# Patient Record
Sex: Female | Born: 1935 | Race: White | Hispanic: No | Smoking: Never smoker
Health system: Southern US, Community
[De-identification: ages and names within clinical notes are randomized; demographics above are authoritative.]

## PROBLEM LIST (undated history)

## (undated) DIAGNOSIS — K7689 Other specified diseases of liver: Secondary | ICD-10-CM

## (undated) DIAGNOSIS — F32A Depression, unspecified: Secondary | ICD-10-CM

## (undated) DIAGNOSIS — K5792 Diverticulitis of intestine, part unspecified, without perforation or abscess without bleeding: Secondary | ICD-10-CM

## (undated) DIAGNOSIS — F419 Anxiety disorder, unspecified: Secondary | ICD-10-CM

## (undated) DIAGNOSIS — I10 Essential (primary) hypertension: Secondary | ICD-10-CM

## (undated) DIAGNOSIS — K5732 Diverticulitis of large intestine without perforation or abscess without bleeding: Secondary | ICD-10-CM

## (undated) DIAGNOSIS — D126 Benign neoplasm of colon, unspecified: Secondary | ICD-10-CM

## (undated) DIAGNOSIS — Z8719 Personal history of other diseases of the digestive system: Secondary | ICD-10-CM

## (undated) DIAGNOSIS — K219 Gastro-esophageal reflux disease without esophagitis: Secondary | ICD-10-CM

## (undated) DIAGNOSIS — F329 Major depressive disorder, single episode, unspecified: Secondary | ICD-10-CM

## (undated) DIAGNOSIS — M1990 Unspecified osteoarthritis, unspecified site: Secondary | ICD-10-CM

## (undated) HISTORY — DX: Diverticulitis of large intestine without perforation or abscess without bleeding: K57.32

## (undated) HISTORY — DX: Benign neoplasm of colon, unspecified: D12.6

## (undated) HISTORY — PX: TONSILLECTOMY: SUR1361

## (undated) HISTORY — DX: Unspecified osteoarthritis, unspecified site: M19.90

## (undated) HISTORY — PX: BREAST SURGERY: SHX581

## (undated) HISTORY — DX: Gastro-esophageal reflux disease without esophagitis: K21.9

## (undated) HISTORY — DX: Essential (primary) hypertension: I10

## (undated) HISTORY — DX: Anxiety disorder, unspecified: F41.9

## (undated) HISTORY — DX: Other specified diseases of liver: K76.89

## (undated) HISTORY — PX: ABDOMINAL HYSTERECTOMY: SHX81

## (undated) HISTORY — PX: VAGINAL HYSTERECTOMY: SUR661

## (undated) HISTORY — DX: Depression, unspecified: F32.A

## (undated) HISTORY — PX: EYE SURGERY: SHX253

## (undated) HISTORY — DX: Major depressive disorder, single episode, unspecified: F32.9

## (undated) HISTORY — PX: BUNIONECTOMY: SHX129

## (undated) HISTORY — PX: OOPHORECTOMY: SHX86

---

## 1998-10-23 ENCOUNTER — Encounter: Payer: Self-pay | Admitting: *Deleted

## 1998-10-23 ENCOUNTER — Ambulatory Visit (HOSPITAL_COMMUNITY): Admission: RE | Admit: 1998-10-23 | Discharge: 1998-10-23 | Payer: Self-pay | Admitting: *Deleted

## 2001-03-02 ENCOUNTER — Other Ambulatory Visit: Admission: RE | Admit: 2001-03-02 | Discharge: 2001-03-02 | Payer: Self-pay | Admitting: Obstetrics and Gynecology

## 2002-05-13 ENCOUNTER — Other Ambulatory Visit: Admission: RE | Admit: 2002-05-13 | Discharge: 2002-05-13 | Payer: Self-pay | Admitting: Obstetrics and Gynecology

## 2002-06-26 ENCOUNTER — Ambulatory Visit (HOSPITAL_COMMUNITY): Admission: RE | Admit: 2002-06-26 | Discharge: 2002-06-26 | Payer: Self-pay | Admitting: Neurology

## 2002-06-26 ENCOUNTER — Encounter: Payer: Self-pay | Admitting: Neurology

## 2003-01-01 DIAGNOSIS — D126 Benign neoplasm of colon, unspecified: Secondary | ICD-10-CM

## 2003-01-01 HISTORY — DX: Benign neoplasm of colon, unspecified: D12.6

## 2005-01-27 ENCOUNTER — Other Ambulatory Visit: Admission: RE | Admit: 2005-01-27 | Discharge: 2005-01-27 | Payer: Self-pay | Admitting: Obstetrics and Gynecology

## 2005-03-31 ENCOUNTER — Encounter: Admission: RE | Admit: 2005-03-31 | Discharge: 2005-05-19 | Payer: Self-pay | Admitting: *Deleted

## 2005-08-19 ENCOUNTER — Ambulatory Visit: Payer: Self-pay | Admitting: Gastroenterology

## 2005-10-04 ENCOUNTER — Ambulatory Visit: Payer: Self-pay | Admitting: Gastroenterology

## 2005-10-10 ENCOUNTER — Ambulatory Visit: Payer: Self-pay | Admitting: Gastroenterology

## 2006-05-05 ENCOUNTER — Ambulatory Visit: Payer: Self-pay | Admitting: Gastroenterology

## 2006-05-05 LAB — CONVERTED CEMR LAB
ALT: 28 units/L (ref 0–40)
AST: 26 units/L (ref 0–37)
Albumin: 4 g/dL (ref 3.5–5.2)
Alkaline Phosphatase: 60 units/L (ref 39–117)
Bilirubin, Direct: 0.1 mg/dL (ref 0.0–0.3)
Lipase: 40 units/L (ref 11.0–59.0)
Total Bilirubin: 0.5 mg/dL (ref 0.3–1.2)
Total Protein: 6.7 g/dL (ref 6.0–8.3)

## 2006-05-08 ENCOUNTER — Ambulatory Visit: Payer: Self-pay | Admitting: Cardiology

## 2006-05-31 ENCOUNTER — Ambulatory Visit: Payer: Self-pay | Admitting: Gastroenterology

## 2010-02-21 ENCOUNTER — Encounter: Payer: Self-pay | Admitting: Gastroenterology

## 2010-04-21 ENCOUNTER — Emergency Department (HOSPITAL_COMMUNITY)
Admission: EM | Admit: 2010-04-21 | Discharge: 2010-04-21 | Disposition: A | Discharge status: Home / Self Care | Payer: Medicare Other | Attending: Emergency Medicine | Admitting: Emergency Medicine

## 2010-04-21 DIAGNOSIS — M79609 Pain in unspecified limb: Secondary | ICD-10-CM | POA: Insufficient documentation

## 2010-04-21 DIAGNOSIS — T394X5A Adverse effect of antirheumatics, not elsewhere classified, initial encounter: Secondary | ICD-10-CM | POA: Insufficient documentation

## 2010-04-21 DIAGNOSIS — L2989 Other pruritus: Secondary | ICD-10-CM | POA: Insufficient documentation

## 2010-04-21 DIAGNOSIS — I10 Essential (primary) hypertension: Secondary | ICD-10-CM | POA: Insufficient documentation

## 2010-04-21 DIAGNOSIS — L298 Other pruritus: Secondary | ICD-10-CM | POA: Insufficient documentation

## 2010-04-21 DIAGNOSIS — R0789 Other chest pain: Secondary | ICD-10-CM | POA: Insufficient documentation

## 2010-04-21 DIAGNOSIS — F3289 Other specified depressive episodes: Secondary | ICD-10-CM | POA: Insufficient documentation

## 2010-04-21 DIAGNOSIS — F329 Major depressive disorder, single episode, unspecified: Secondary | ICD-10-CM | POA: Insufficient documentation

## 2010-04-21 LAB — DIFFERENTIAL
Basophils Absolute: 0 10*3/uL (ref 0.0–0.1)
Basophils Relative: 0 % (ref 0–1)
Eosinophils Absolute: 0.1 10*3/uL (ref 0.0–0.7)
Eosinophils Relative: 1 % (ref 0–5)
Lymphocytes Relative: 52 % — ABNORMAL HIGH (ref 12–46)
Lymphs Abs: 4.8 10*3/uL — ABNORMAL HIGH (ref 0.7–4.0)
Monocytes Absolute: 0.7 10*3/uL (ref 0.1–1.0)
Monocytes Relative: 8 % (ref 3–12)
Neutro Abs: 3.7 10*3/uL (ref 1.7–7.7)
Neutrophils Relative %: 39 % — ABNORMAL LOW (ref 43–77)

## 2010-04-21 LAB — POCT I-STAT, CHEM 8
BUN: 22 mg/dL (ref 6–23)
Chloride: 106 mEq/L (ref 96–112)
Creatinine, Ser: 1.2 mg/dL (ref 0.4–1.2)
Potassium: 3.5 mEq/L (ref 3.5–5.1)
Sodium: 141 mEq/L (ref 135–145)
TCO2: 25 mmol/L (ref 0–100)

## 2010-04-21 LAB — POCT CARDIAC MARKERS
CKMB, poc: <1 ng/mL — ABNORMAL LOW (ref 1.0–8.0)
Myoglobin, poc: 43.4 ng/mL (ref 12–200)
Troponin i, poc: <0.05 ng/mL (ref 0.00–0.09)

## 2010-04-21 LAB — CBC
Hemoglobin: 14 g/dL (ref 12.0–15.0)
MCH: 30.4 pg (ref 26.0–34.0)
Platelets: 283 10*3/uL (ref 150–400)
RBC: 4.61 MIL/uL (ref 3.87–5.11)
WBC: 9.3 10*3/uL (ref 4.0–10.5)

## 2010-11-05 ENCOUNTER — Encounter: Payer: Self-pay | Admitting: Gastroenterology

## 2010-11-30 ENCOUNTER — Encounter: Payer: Self-pay | Admitting: Gastroenterology

## 2011-01-13 ENCOUNTER — Telehealth: Payer: Self-pay | Admitting: Gastroenterology

## 2011-01-13 NOTE — Telephone Encounter (Signed)
All questions answered, she will keep her appt for pre-visit on Monday

## 2011-01-20 ENCOUNTER — Encounter: Payer: Medicare Other | Admitting: Gastroenterology

## 2011-02-04 ENCOUNTER — Ambulatory Visit (AMBULATORY_SURGERY_CENTER): Payer: Medicare Other | Admitting: *Deleted

## 2011-02-04 ENCOUNTER — Encounter: Payer: Self-pay | Admitting: Gastroenterology

## 2011-02-04 VITALS — Ht 63.0 in | Wt 168.0 lb

## 2011-02-04 DIAGNOSIS — Z86010 Personal history of colonic polyps: Secondary | ICD-10-CM

## 2011-02-04 MED ORDER — PEG-KCL-NACL-NASULF-NA ASC-C 100 G PO SOLR: ORAL | Status: DC

## 2011-02-04 NOTE — Progress Notes (Signed)
Patient c/o reflux symptoms everyday, takes tums for this. Patient wants EGD done. Explained to patient the need for office visit,she understands and agrees. Made office visit for 02/16/11 at 10:15 am. Patient wants to keep colonoscopy date at this time but she does want both exams same day. Explained to patient if Dr.Stark wants this then they may have to reschedule colonoscopy. She understands. Sheri Jones,RN notified.

## 2011-02-16 ENCOUNTER — Encounter: Payer: Self-pay | Admitting: Gastroenterology

## 2011-02-16 ENCOUNTER — Ambulatory Visit (INDEPENDENT_AMBULATORY_CARE_PROVIDER_SITE_OTHER): Payer: Medicare Other | Admitting: Gastroenterology

## 2011-02-16 VITALS — BP 130/74 | HR 96 | Ht 63.0 in | Wt 163.2 lb

## 2011-02-16 DIAGNOSIS — Z860100 Personal history of colon polyps, unspecified: Secondary | ICD-10-CM

## 2011-02-16 DIAGNOSIS — K219 Gastro-esophageal reflux disease without esophagitis: Secondary | ICD-10-CM

## 2011-02-16 MED ORDER — PANTOPRAZOLE SODIUM 40 MG PO TBEC: 40.0000 mg | DELAYED_RELEASE_TABLET | Freq: Every day | ORAL | Status: DC

## 2011-02-16 NOTE — Progress Notes (Signed)
History of Present Illness: This is a 77 year old female who has a long history of acid reflux. She an upper endoscopy by Dr. Davonna Belling in September 2007 which showed esophagitis. She was prescribed Prevacid which she states was not effective. She was eventually changed to omeprazole which effectively controlled her reflux symptoms but she developed thinning hair and was concerned it was related to omeprazole so she discontinued the medication. She has daily symptoms of nocturnal symptoms on no acid medications at all. She is scheduled for colonoscopy suite for followup of colon polyps. Denies weight loss, abdominal pain, constipation, diarrhea, change in stool caliber, melena, hematochezia, nausea, vomiting, dysphagia, chest pain.  Allergies  Allergen Reactions  . Celebrex (Celecoxib) Anaphylaxis  . Ciprofloxacin Hcl Hives  . Flagyl (Metronidazole Hcl) Hives  . Nsaids Other (See Comments)    Because of reaction to celebrex.   Outpatient Prescriptions Prior to Visit  Medication Sig Dispense Refill  . BIOTIN PO Take 10,000 mcg by mouth daily.        . citalopram (CELEXA) 20 MG tablet Take 20 mg by mouth daily.        Marland Kitchen lisinopril-hydrochlorothiazide (PRINZIDE,ZESTORETIC) 20-12.5 MG per tablet Take 1 tablet by mouth daily.        . Omega-3 Krill Oil 500 MG CAPS Take 1 capsule by mouth daily.        . peg 3350 powder (MOVIPREP) 100 G SOLR Moviprep-take as directed.  1 kit  0  . UNABLE TO FIND Take 2 tablets by mouth daily. Med Name: Tumeric 500mg         Past Medical History  Diagnosis Date  . Hypertension   . Depression   . Anxiety   . Arthritis   . Cataract   . Diverticulitis, colon   . Liver cyst   . Adenomatous colon polyp 01/2003  . GERD (gastroesophageal reflux disease)    Past Surgical History  Procedure Date  . Abdominal hysterectomy   . Tonsillectomy   . Breast surgery     benign breast tumors removed x3  . Oophorectomy    History   Social History  . Marital Status:  Married    Spouse Name: N/A    Number of Children: N/A  . Years of Education: N/A   Social History Main Topics  . Smoking status: Never Smoker   . Smokeless tobacco: Never Used  . Alcohol Use: 0.0 oz/week     occasional  . Drug Use: No  . Sexually Active: None   Other Topics Concern  . None   Social History Narrative  . None   Family History  Problem Relation Age of Onset  . Colon cancer Neg Hx   . Stomach cancer Neg Hx   . Esophageal cancer Neg Hx   . Breast cancer Sister   . Prostate cancer Brother     ????  . Diabetes Other     granddaughter  . Heart disease Mother   . Heart disease Father     Review of Systems: Pertinent positive and negative review of systems were noted in the above HPI section. All other review of systems were otherwise negative.   Physical Exam: General: Well developed , well nourished, no acute distress Head: Normocephalic and atraumatic Eyes:  sclerae anicteric, EOMI Ears: Normal auditory acuity Mouth: No deformity or lesions Neck: Supple, no masses or thyromegaly Lungs: Clear throughout to auscultation Heart: Regular rate and rhythm; no murmurs, rubs or bruits Abdomen: Soft, non tender and non distended.  No masses, hepatosplenomegaly or hernias noted. Normal Bowel sounds Rectal: Deferred to colonoscopy on Friday Musculoskeletal: Symmetrical with no gross deformities  Skin: No lesions on visible extremities Pulses:  Normal pulses noted Extremities: No clubbing, cyanosis, edema or deformities noted Neurological: Alert oriented x 4, grossly nonfocal Cervical Nodes:  No significant cervical adenopathy Inguinal Nodes: No significant inguinal adenopathy Psychological:  Alert and cooperative. Normal mood and affect  Assessment and Recommendations:  1. Chronic GERD with a history of esophagitis on her last upper endoscopy. She needs to be on long-term acid suppressing medication and antireflux measures for symptom control. Trial of  pantoprazole 40 mg daily.  2. Personal history of adenomatous colon polyps initially diagnosed in December 2004. She is due for a 5 years surveillance colonoscopy been scheduled for Friday.

## 2011-02-16 NOTE — Patient Instructions (Signed)
We have sent a prescription for pantoprazole to your pharmacy to take one tablet by mouth once daily.  cc: Daisy Floro, MD

## 2011-02-18 ENCOUNTER — Ambulatory Visit (AMBULATORY_SURGERY_CENTER): Payer: Medicare Other | Admitting: Gastroenterology

## 2011-02-18 ENCOUNTER — Encounter: Payer: Medicare Other | Admitting: Gastroenterology

## 2011-02-18 ENCOUNTER — Encounter: Payer: Self-pay | Admitting: Gastroenterology

## 2011-02-18 DIAGNOSIS — D126 Benign neoplasm of colon, unspecified: Secondary | ICD-10-CM

## 2011-02-18 DIAGNOSIS — Z1211 Encounter for screening for malignant neoplasm of colon: Secondary | ICD-10-CM

## 2011-02-18 DIAGNOSIS — Z86010 Personal history of colonic polyps: Secondary | ICD-10-CM

## 2011-02-18 MED ORDER — SODIUM CHLORIDE 0.9 % IV SOLN: 500.0000 mL | INTRAVENOUS | Status: DC

## 2011-02-18 NOTE — Progress Notes (Signed)
Patient did not experience any of the following events: a burn prior to discharge; a fall within the facility; wrong site/side/patient/procedure/implant event; or a hospital transfer or hospital admission upon discharge from the facility. (G8907) Patient did not have preoperative order for IV antibiotic SSI prophylaxis. (G8918)  

## 2011-02-18 NOTE — Patient Instructions (Signed)
Please refer to your blue and neon green sheets for instructions regarding diet and activity for the rest of today.  You may resume your medications as you would normally take them.   Hemorrhoids Hemorrhoids are enlarged (dilated) veins around the rectum. There are 2 types of hemorrhoids, and the type of hemorrhoid is determined by its location. Internal hemorrhoids occur in the veins just inside the rectum.They are usually not painful, but they may bleed.However, they may poke through to the outside and become irritated and painful. External hemorrhoids involve the veins outside the anus and can be felt as a painful swelling or hard lump near the anus.They are often itchy and may crack and bleed. Sometimes clots will form in the veins. This makes them swollen and painful. These are called thrombosed hemorrhoids. CAUSES Causes of hemorrhoids include:  Pregnancy. This increases the pressure in the hemorrhoidal veins.   Constipation.   Straining to have a bowel movement.   Obesity.   Heavy lifting or other activity that caused you to strain.  TREATMENT Most of the time hemorrhoids improve in 1 to 2 weeks. However, if symptoms do not seem to be getting better or if you have a lot of rectal bleeding, your caregiver may perform a procedure to help make the hemorrhoids get smaller or remove them completely.Possible treatments include:  Rubber band ligation. A rubber band is placed at the base of the hemorrhoid to cut off the circulation.   Sclerotherapy. A chemical is injected to shrink the hemorrhoid.   Infrared light therapy. Tools are used to burn the hemorrhoid.   Hemorrhoidectomy. This is surgical removal of the hemorrhoid.  HOME CARE INSTRUCTIONS   Increase fiber in your diet. Ask your caregiver about using fiber supplements.   Drink enough water and fluids to keep your urine clear or pale yellow.   Exercise regularly.   Go to the bathroom when you have the urge to have a  bowel movement. Do not wait.   Avoid straining to have bowel movements.   Keep the anal area dry and clean.   Only take over-the-counter or prescription medicines for pain, discomfort, or fever as directed by your caregiver.  If your hemorrhoids are thrombosed:  Take warm sitz baths for 20 to 30 minutes, 3 to 4 times per day.   If the hemorrhoids are very tender and swollen, place ice packs on the area as tolerated. Using ice packs between sitz baths may be helpful. Fill a plastic bag with ice. Place a towel between the bag of ice and your skin.   Medicated creams and suppositories may be used or applied as directed.   Do not use a donut-shaped pillow or sit on the toilet for long periods. This increases blood pooling and pain.  SEEK MEDICAL CARE IF:   You have increasing pain and swelling that is not controlled with your medicine.   You have uncontrolled bleeding.   You have difficulty or you are unable to have a bowel movement.   You have pain or inflammation outside the area of the hemorrhoids.   You have chills or an oral temperature above 102 F (38.9 C).  MAKE SURE YOU:   Understand these instructions.   Will watch your condition.   Will get help right away if you are not doing well or get worse.  Document Released: 01/15/2000 Document Revised: 09/29/2010 Document Reviewed: 05/22/2007 [employer] Patient Information 2012 [employer].  Diverticulosis Diverticulosis is a common condition that develops when small   pouches (diverticula) form in the wall of the colon. The risk of diverticulosis increases with age. It happens more often in people who eat a low-fiber diet. Most individuals with diverticulosis have no symptoms. Those individuals with symptoms usually experience abdominal pain, constipation, or loose stools (diarrhea). HOME CARE INSTRUCTIONS   Increase the amount of fiber in your diet as directed by your caregiver or dietician. This may reduce symptoms of  diverticulosis.   Your caregiver may recommend taking a dietary fiber supplement.   Drink at least 6 to 8 glasses of water each day to prevent constipation.   Try not to strain when you have a bowel movement.   Your caregiver may recommend avoiding nuts and seeds to prevent complications, although this is still an uncertain benefit.   Only take over-the-counter or prescription medicines for pain, discomfort, or fever as directed by your caregiver.  FOODS WITH HIGH FIBER CONTENT INCLUDE:  Fruits. Apple, peach, pear, tangerine, raisins, prunes.   Vegetables. Brussels sprouts, asparagus, broccoli, cabbage, carrot, cauliflower, romaine lettuce, spinach, summer squash, tomato, winter squash, zucchini.   Starchy Vegetables. Baked beans, kidney beans, lima beans, split peas, lentils, potatoes (with skin).   Grains. Whole wheat bread, brown rice, bran flake cereal, plain oatmeal, white rice, shredded wheat, bran muffins.  SEEK IMMEDIATE MEDICAL CARE IF:   You develop increasing pain or severe bloating.   You have an oral temperature above 102 F (38.9 C), not controlled by medicine.   You develop vomiting or bowel movements that are bloody or black.  Document Released: 10/15/2003 Document Revised: 09/29/2010 Document Reviewed: 06/17/2009 [facility] Patient Information 2012 [place], [place].  Colon Polyps A polyp is extra tissue that grows inside your body. Colon polyps grow in the large intestine. The large intestine, also called the colon, is part of your digestive system. It is a long, hollow tube at the end of your digestive tract where your body makes and stores stool. Most polyps are not dangerous. They are benign. This means they are not cancerous. But over time, some types of polyps can turn into cancer. Polyps that are smaller than a pea are usually not harmful. But larger polyps could someday become or may already be cancerous. To be safe, doctors remove all polyps and test them.  WHO  GETS POLYPS? Anyone can get polyps, but certain people are more likely than others. You may have a greater chance of getting polyps if:  You are over 50.   You have had polyps before.   Someone in your family has had polyps.   Someone in your family has had cancer of the large intestine.   Find out if someone in your family has had polyps. You may also be more likely to get polyps if you:   Eat a lot of fatty foods.   Smoke.   Drink alcohol.   Do not exercise.   Eat too much.  SYMPTOMS  Most small polyps do not cause symptoms. People often do not know they have one until their caregiver finds it during a regular checkup or while testing them for something else. Some people do have symptoms like these:  Bleeding from the anus. You might notice blood on your underwear or on toilet paper after you have had a bowel movement.   Constipation or diarrhea that lasts more than a week.   Blood in the stool. Blood can make stool look black or it can show up as red streaks in the stool.  If you  have any of these symptoms, see your caregiver. HOW DOES THE DOCTOR TEST FOR POLYPS? The doctor can use four tests to check for polyps:  Digital rectal exam. The caregiver wears gloves and checks your rectum (the last part of the large intestine) to see if it feels normal. This test would find polyps only in the rectum. Your caregiver may need to do one of the other tests listed below to find polyps higher up in the intestine.   Barium enema. The caregiver puts a liquid called barium into your rectum before taking x-rays of your large intestine. Barium makes your intestine look white in the pictures. Polyps are dark, so they are easy to see.   Sigmoidoscopy. With this test, the caregiver can see inside your large intestine. A thin flexible tube is placed into your rectum. The device is called a sigmoidoscope, which has a light and a tiny video camera in it. The caregiver uses the sigmoidoscope to  look at the last third of your large intestine.   Colonoscopy. This test is like sigmoidoscopy, but the caregiver looks at all of the large intestine. It usually requires sedation. This is the most common method for finding and removing polyps.  TREATMENT   The caregiver will remove the polyp during sigmoidoscopy or colonoscopy. The polyp is then tested for cancer.   If you have had polyps, your caregiver may want you to get tested regularly in the future.  PREVENTION  There is not one sure way to prevent polyps. You might be able to lower your risk of getting them if you:  Eat more fruits and vegetables and less fatty food.   Do not smoke.   Avoid alcohol.   Exercise every day.   Lose weight if you are overweight.   Eating more calcium and folate can also lower your risk of getting polyps. Some foods that are rich in calcium are milk, cheese, and broccoli. Some foods that are rich in folate are chickpeas, kidney beans, and spinach.   Aspirin might help prevent polyps. Studies are under way.  Document Released: 10/14/2003 Document Revised: 09/29/2010 Document Reviewed: 03/21/2007 [facility] Patient Information 2012 [place], [place].

## 2011-02-18 NOTE — Op Note (Signed)
[facility] [address] [place], [place]  [address]  COLONOSCOPY PROCEDURE REPORT  PATIENT:  Valerie Fox, Valerie Fox  MR#:  [id number] BIRTHDATE:  July 26, 1934, 76 yrs. old  GENDER:  female ENDOSCOPIST:  Judie Petit T. Russella Dar, MD, [facility]  PROCEDURE DATE:  02/18/2011 PROCEDURE:  Colonoscopy with snare polypectomy ASA CLASS:  Class II INDICATIONS:  1) surveillance and high-risk screening  2) history of pre-cancerous (adenomatous) colon polyps: 01/2003 MEDICATIONS:   MAC sedation, administered by CRNA, propofol (Diprivan) 240 mg IV DESCRIPTION OF PROCEDURE:   After the risks benefits and alternatives of the procedure were thoroughly explained, informed consent was obtained.  Digital rectal exam was performed and revealed no abnormalities.   The LB CF-[id number] endoscope was introduced through the anus and advanced to the cecum, which was identified by both the appendix and ileocecal valve, without limitations.  The quality of the prep was good, using MoviPrep. The instrument was then slowly withdrawn as the colon was fully examined. <<PROCEDUREIMAGES>> FINDINGS:  A sessile polyp was found in the mid transverse colon. It was 5 mm in size. Polyp was snared without cautery. Retrieval was successful.  Moderate diverticulosis was found in the sigmoid to descending colon. Otherwise normal colonoscopy without other polyps, masses, vascular ectasias, or inflammatory changes. Retroflexed views in the rectum revealed internal hemorrhoids, small. The time to cecum =  3.5  minutes. The scope was then withdrawn (time =  8.75  min) from the patient and the procedure completed.  COMPLICATIONS:  None  ENDOSCOPIC IMPRESSION: 1) 5 mm sessile polyp in the mid transverse colon 2) Moderate diverticulosis in the sigmoid to descending colon 3) Internal hemorrhoids  RECOMMENDATIONS: 1) Await pathology results 2) High fiber diet with liberal fluid intake. 3) Repeat Colonoscopy in 5 years.  Venita Lick. Russella Dar, MD, [facility]  CC:  Duane Lope, MD  n. Rosalie DoctorVenita Lick. Gean Laursen at 02/18/2011 11:47 AM  [place], [place], [id number]

## 2011-02-21 ENCOUNTER — Telehealth: Payer: Self-pay | Admitting: *Deleted

## 2011-02-21 NOTE — Telephone Encounter (Signed)
Left message

## 2011-02-22 ENCOUNTER — Encounter: Payer: Self-pay | Admitting: Gastroenterology

## 2011-03-24 DIAGNOSIS — I10 Essential (primary) hypertension: Secondary | ICD-10-CM | POA: Insufficient documentation

## 2011-03-24 DIAGNOSIS — Z8719 Personal history of other diseases of the digestive system: Secondary | ICD-10-CM | POA: Insufficient documentation

## 2015-12-22 ENCOUNTER — Encounter: Payer: Self-pay | Admitting: Gastroenterology

## 2017-03-20 ENCOUNTER — Encounter: Payer: Self-pay | Admitting: Physician Assistant

## 2017-03-30 ENCOUNTER — Encounter (INDEPENDENT_AMBULATORY_CARE_PROVIDER_SITE_OTHER): Payer: Self-pay

## 2017-03-30 ENCOUNTER — Telehealth: Payer: Self-pay | Admitting: *Deleted

## 2017-03-30 ENCOUNTER — Ambulatory Visit: Payer: Medicare Other | Admitting: Physician Assistant

## 2017-03-30 ENCOUNTER — Encounter: Payer: Self-pay | Admitting: Physician Assistant

## 2017-03-30 VITALS — BP 100/60 | HR 80 | Ht 63.0 in | Wt 173.4 lb

## 2017-03-30 DIAGNOSIS — Z86010 Personal history of colonic polyps: Secondary | ICD-10-CM

## 2017-03-30 DIAGNOSIS — I493 Ventricular premature depolarization: Secondary | ICD-10-CM

## 2017-03-30 DIAGNOSIS — Z8719 Personal history of other diseases of the digestive system: Secondary | ICD-10-CM

## 2017-03-30 MED ORDER — SUPREP BOWEL PREP KIT 17.5-3.13-1.6 GM/177ML PO SOLN: 1.0000 | ORAL | 0 refills | Status: DC

## 2017-03-30 NOTE — Progress Notes (Addendum)
Chief Complaint: Left lower quadrant pain, history of tubular adenomas  HPI:     Valerie Fox is an 83 year old female with a past medical history of adenomatous colon polyps, anxiety and reflux, who follows with Dr. Fuller Plan and was referred to me by Lawerance Cruel, MD for a complaint of left lower quadrant abdominal pain.      Last seen in clinic 02/16/11 for acid reflux.  Given trial of Pantoprazole 40 mg daily.  Also scheduled for a colonoscopy due to history of adenomatous colon polyps.  Colonoscopy 02/18/11 with a 5 mm sessile polyp in the mid transverse colon, moderate diverticulosis in the sigmoid to descending colon and internal hemorrhoids.  Pathology tubular adenoma.  Repeat was recommended in 5 years.    Today, expresses she has had "flares of my diverticulitis" off and on through the years, but most recently has had 2 flares close together.  One started in November with left lower quadrant pain worse when she would move around and was treated with antibiotics and cleared in December.  Aanother episode about 14 days ago and was given Augmentin which she just finished 4 days ago.  Antibiotics always relieve her left lower quadrant discomfort and everything is back to normal now but was told by one of her physicians that she should inquire about having her surveillance colonoscopy as she has had a couple of flares close together to make sure nothing else is happening.    Medical history is positive for wearing heart monitor currently to monitor her PVCs.  She has follow-up with cardiology in March.  Patient recently had an echo which she tells me was mostly normal.    Denies fever, chills, blood in her stool, melena, weight loss, anorexia, nausea, vomiting or symptoms that awaken her at night.  Past Medical History:  Diagnosis Date  . Adenomatous colon polyp 01/2003  . Anxiety   . Arthritis   . Cataract   . Depression   . Diverticulitis, colon   . GERD (gastroesophageal reflux disease)    . Hypertension   . Liver cyst     Past Surgical History:  Procedure Laterality Date  . ABDOMINAL HYSTERECTOMY    . BREAST SURGERY     benign breast tumors removed x3  . OOPHORECTOMY    . TONSILLECTOMY      Current Outpatient Medications  Medication Sig Dispense Refill  . BIOTIN PO Take 10,000 mcg by mouth daily.      . citalopram (CELEXA) 20 MG tablet Take 20 mg by mouth daily.      Marland Kitchen lisinopril-hydrochlorothiazide (PRINZIDE,ZESTORETIC) 20-12.5 MG per tablet Take 1 tablet by mouth daily.      . nitrofurantoin, macrocrystal-monohydrate, (MACROBID) 100 MG capsule Take 100 mg by mouth 2 (two) times daily. X 4 days as needed for UTI    . Omega-3 Krill Oil 500 MG CAPS Take 1 capsule by mouth daily.      . pantoprazole (PROTONIX) 40 MG tablet Take 1 tablet (40 mg total) by mouth daily. 30 tablet 11  . UNABLE TO FIND Take 2 tablets by mouth daily. Med Name: Tumeric 500mg       No current facility-administered medications for this visit.     Allergies as of 03/30/2017 - Review Complete 02/18/2011  Allergen Reaction Noted  . Celebrex [celecoxib] Anaphylaxis 02/04/2011  . Ciprofloxacin hcl Hives 02/04/2011  . Flagyl [metronidazole hcl] Hives 02/04/2011  . Nsaids Other (See Comments) 02/04/2011    Family History  Problem Relation Age  of Onset  . Colon cancer Neg Hx   . Stomach cancer Neg Hx   . Esophageal cancer Neg Hx   . Breast cancer Sister   . Prostate cancer Brother        ????  . Diabetes Other        granddaughter  . Heart disease Mother   . Heart disease Father     Social History   Socioeconomic History  . Marital status: Married    Spouse name: Not on file  . Number of children: Not on file  . Years of education: Not on file  . Highest education level: Not on file  Social Needs  . Financial resource strain: Not on file  . Food insecurity - worry: Not on file  . Food insecurity - inability: Not on file  . Transportation needs - medical: Not on file  .  Transportation needs - non-medical: Not on file  Occupational History  . Not on file  Tobacco Use  . Smoking status: Never Smoker  . Smokeless tobacco: Never Used  Substance and Sexual Activity  . Alcohol use: Yes    Alcohol/week: 0.0 oz    Comment: occasional  . Drug use: No  . Sexual activity: Not on file  Other Topics Concern  . Not on file  Social History Narrative  . Not on file    Review of Systems:    Constitutional: No weight loss, fever or chills Skin: No rash Cardiovascular: + palpitations Respiratory: No SOB Gastrointestinal: See HPI and otherwise negative Genitourinary: No dysuria Neurological: + positional dizziness Musculoskeletal: No new muscle or joint pain Hematologic: No bleeding  Psychiatric: No history of depression or anxiety   Physical Exam:  Vital signs: BP 100/60   Pulse 80   Ht 5\' 3"  (1.6 m)   Wt 173 lb 6.4 oz (78.7 kg)   BMI 30.72 kg/m    Constitutional:   Very Pleasant Healthy appearing Elderly Caucasian female appears to be in NAD, Well developed, Well nourished, alert and cooperative Head:  Normocephalic and atraumatic. Eyes:   PEERL, EOMI. No icterus. Conjunctiva pink. Ears:  Normal auditory acuity. Neck:  Supple Throat: Oral cavity and pharynx without inflammation, swelling or lesion.  Respiratory: Respirations even and unlabored. Lungs clear to auscultation bilaterally.   No wheezes, crackles, or rhonchi.  Cardiovascular: Normal S1, S2. No MRG. Regular rate and rhythm. No peripheral edema, cyanosis or pallor.  Gastrointestinal:  Soft, nondistended, nontender. No rebound or guarding. Normal bowel sounds. No appreciable masses or hepatomegaly. Rectal:  Not performed.  Msk:  Symmetrical without gross deformities. Without edema, no deformity or joint abnormality.  Neurologic:  Alert and  oriented x4;  grossly normal neurologically.  Skin:   Dry and intact without significant lesions or rashes. Psychiatric: Demonstrates good judgement and  reason without abnormal affect or behaviors.  No recent labs or imaging.  Assessment: 1. LLQ abdominal pain: History of diverticulitis treated by PCP, one episode in November and another about 14 days ago, finished antibiotics 4 days ago, requesting to have her colonoscopy 2. H/o Adenomatous polyps: Last 2013 with one tubular adenoma recommendations for repeat in 5 years 3.  PVCs: Currently wearing a heart monitor, has follow with cardiology at the end of next month  Plan: 1.  Discussed with patient that I would like to wait her heart workup prior to scheduling her for a surveillance colonoscopy due to her history of colon polyps.  Patient is in agreement.  We will send  cardiac clearance to Dr. Wynonia Lawman. 2.  We will go ahead and schedule patient as far out as we can with Dr. Fuller Plan per her request.  Scheduled surveillance colonoscopy in the [place].  Did discuss risk, benefits, limitations and alternatives and the patient agrees to proceed.  She is high risk for this procedure given her advanced age but would like to proceed. 3.  Patient to follow in clinic per recommendations after colonoscopy with Dr. Fuller Plan.  Ellouise Newer, PA-C [facility] 03/30/2017, 9:13 AM  Cc: Lawerance Cruel, MD   05/04/17 Received recent ECHO with EF 60%, trace mitral regurgitation, mild pulmonic and tricuspid regurgitation and mild aortic regurgitation. Patient was cleared by Dr. Wynonia Lawman for colonoscopy. JLL

## 2017-03-30 NOTE — Patient Instructions (Signed)
You have been scheduled for a colonoscopy. Please follow written instructions given to you at your visit today.  Please pick up your prep supplies at the pharmacy within the next 1-3 days. If you use inhalers (even only as needed), please bring them with you on the day of your procedure. Your physician has requested that you go to www.startemmi.com and enter the access code given to you at your visit today. This web site gives a general overview about your procedure. However, you should still follow specific instructions given to you by our office regarding your preparation for the procedure.  We will contact Dr Wynonia Lawman regarding cardiac clearance for your upcoming colonoscopy.  If you are age 69 or older, your body mass index should be between 23-30. Your Body mass index is 30.72 kg/m. If this is out of the aforementioned range listed, please consider follow up with your Primary Care Provider.  If you are age 39 or younger, your body mass index should be between 19-25. Your Body mass index is 30.72 kg/m. If this is out of the aformentioned range listed, please consider follow up with your Primary Care Provider.

## 2017-03-30 NOTE — Progress Notes (Signed)
Reviewed and agree with management plan.  Laurabeth Yip T. Masayuki Sakai, MD FACG 

## 2017-03-30 NOTE — Telephone Encounter (Signed)
   Valerie Fox 03-Apr-1934 [id number]  Dear Dr Wynonia Lawman:  We have scheduled the above named patient for a(n) colonoscopy procedure. Our records show that (s)he is being worked up for possible cardiac issues.   We would like to request cardiac clearance if you are okay with patient proceeding with colonoscopy on 06/27/17 in our [place].  Please route your response to Dixon Boos, [place] or fax response to [phone].  Sincerely,  [facility]

## 2017-05-04 NOTE — Telephone Encounter (Signed)
Patient has been cleared by cardiology for procedure per Dr Wynonia Lawman. We have also obtained a copy of her recent echocardiogram completed on 03/17/17 (should be scanned under "media" tab in Epic) in which her EF shows to be estimated at 60%.

## 2017-06-19 ENCOUNTER — Encounter: Payer: Self-pay | Admitting: Gastroenterology

## 2017-06-27 ENCOUNTER — Encounter: Payer: Self-pay | Admitting: Gastroenterology

## 2017-06-27 ENCOUNTER — Ambulatory Visit (AMBULATORY_SURGERY_CENTER): Payer: Medicare Other | Admitting: Gastroenterology

## 2017-06-27 VITALS — BP 111/69 | HR 71 | Temp 99.5°F | Resp 11 | Ht 63.0 in | Wt 173.0 lb

## 2017-06-27 DIAGNOSIS — Z860100 Personal history of colon polyps, unspecified: Secondary | ICD-10-CM

## 2017-06-27 DIAGNOSIS — D123 Benign neoplasm of transverse colon: Secondary | ICD-10-CM | POA: Diagnosis not present

## 2017-06-27 MED ORDER — SODIUM CHLORIDE 0.9 % IV SOLN: 500.0000 mL | Freq: Once | INTRAVENOUS | Status: DC

## 2017-06-27 NOTE — Patient Instructions (Signed)
HANDOUTS GIVEN: POLYPS, DIVERTICULOSIS AND HEMORRHOIDS   YOU HAD AN ENDOSCOPIC PROCEDURE TODAY AT THE [facility]:   Refer to the procedure report that was given to you for any specific questions about what was found during the examination.  If the procedure report does not answer your questions, please call your gastroenterologist to clarify.  If you requested that your care partner not be given the details of your procedure findings, then the procedure report has been included in a sealed envelope for you to review at your convenience later.  YOU SHOULD EXPECT: Some feelings of bloating in the abdomen. Passage of more gas than usual.  Walking can help get rid of the air that was put into your GI tract during the procedure and reduce the bloating. If you had a lower endoscopy (such as a colonoscopy or flexible sigmoidoscopy) you may notice spotting of blood in your stool or on the toilet paper. If you underwent a bowel prep for your procedure, you may not have a normal bowel movement for a few days.  Please Note:  You might notice some irritation and congestion in your nose or some drainage.  This is from the oxygen used during your procedure.  There is no need for concern and it should clear up in a day or so.  SYMPTOMS TO REPORT IMMEDIATELY:   Following lower endoscopy (colonoscopy or flexible sigmoidoscopy):  Excessive amounts of blood in the stool  Significant tenderness or worsening of abdominal pains  Swelling of the abdomen that is new, acute  Fever of 100F or higher   For urgent or emergent issues, a gastroenterologist can be reached at any hour by calling [phone].   DIET:  We do recommend a small meal at first, but then you may proceed to your regular diet.  Drink plenty of fluids but you should avoid alcoholic beverages for 24 hours.  ACTIVITY:  You should plan to take it easy for the rest of today and you should NOT DRIVE or use heavy machinery until tomorrow  (because of the sedation medicines used during the test).    FOLLOW UP: Our staff will call the number listed on your records the next business day following your procedure to check on you and address any questions or concerns that you may have regarding the information given to you following your procedure. If we do not reach you, we will leave a message.  However, if you are feeling well and you are not experiencing any problems, there is no need to return our call.  We will assume that you have returned to your regular daily activities without incident.  If any biopsies were taken you will be contacted by phone or by letter within the next 1-3 weeks.  Please call us at [phone] if you have not heard about the biopsies in 3 weeks.    SIGNATURES/CONFIDENTIALITY: You and/or your care partner have signed paperwork which will be entered into your electronic medical record.  These signatures attest to the fact that that the information above on your After Visit Summary has been reviewed and is understood.  Full responsibility of the confidentiality of this discharge information lies with you and/or your care-partner. 

## 2017-06-27 NOTE — Progress Notes (Signed)
Called to room to assist during endoscopic procedure.  Patient ID and intended procedure confirmed with present staff. Received instructions for my participation in the procedure from the performing physician.  

## 2017-06-27 NOTE — Op Note (Signed)
[facility] Patient Name: Valerie Fox Procedure Date: 06/27/2017 11:09 AM MRN: [id number] Endoscopist: Ladene Artist , MD Age: 83 Referring MD:  Date of Birth: [birth year] Gender: Female Account #: [id number] Procedure:                Colonoscopy Indications:              Surveillance: Personal history of adenomatous                            polyps on last colonoscopy > 5 years ago Medicines:                Monitored Anesthesia Care Procedure:                Pre-Anesthesia Assessment:                           - Prior to the procedure, a History and Physical                            was performed, and patient medications and                            allergies were reviewed. The patient's tolerance of                            previous anesthesia was also reviewed. The risks                            and benefits of the procedure and the sedation                            options and risks were discussed with the patient.                            All questions were answered, and informed consent                            was obtained. Prior Anticoagulants: The patient has                            taken no previous anticoagulant or antiplatelet                            agents. ASA Grade Assessment: II - A patient with                            mild systemic disease. After reviewing the risks                            and benefits, the patient was deemed in                            satisfactory condition to undergo the procedure.  After obtaining informed consent, the colonoscope                            was passed under direct vision. Throughout the                            procedure, the patient's blood pressure, pulse, and                            oxygen saturations were monitored continuously. The                            Model PCF-H190DL [id number]) scope was introduced                            through the anus and  advanced to the the cecum,                            identified by appendiceal orifice and ileocecal                            valve. The ileocecal valve, appendiceal orifice,                            and rectum were photographed. The quality of the                            bowel preparation was adequate. The colonoscopy was                            performed without difficulty. The patient tolerated                            the procedure well. Scope In: 11:17:20 AM Scope Out: 11:31:49 AM Scope Withdrawal Time: 0 hours 9 minutes 29 seconds  Total Procedure Duration: 0 hours 14 minutes 29 seconds  Findings:                 The perianal and digital rectal examinations were                            normal.                           A 7 mm polyp was found in the transverse colon. The                            polyp was sessile. The polyp was removed with a                            cold snare. Resection and retrieval were complete.                           Many medium-mouthed diverticula were found in the  left colon. There was no evidence of diverticular                            bleeding.                           Internal hemorrhoids were found during                            retroflexion. The hemorrhoids were small and Grade                            I (internal hemorrhoids that do not prolapse).                           The exam was otherwise without abnormality on                            direct and retroflexion views. Complications:            No immediate complications. Estimated blood loss:                            None. Estimated Blood Loss:     Estimated blood loss: none. Impression:               - One 7 mm polyp in the transverse colon, removed                            with a cold snare. Resected and retrieved.                           - Mild diverticulosis in the left colon. There was                            no evidence  of diverticular bleeding.                           - Internal hemorrhoids.                           - The examination was otherwise normal on direct                            and retroflexion views. Recommendation:           - Patient has a contact number available for                            emergencies. The signs and symptoms of potential                            delayed complications were discussed with the                            patient. Return to normal activities tomorrow.  Written discharge instructions were provided to the                            patient.                           - High fiber diet.                           - Continue present medications.                           - Await pathology results.                           - No repeat colonoscopy due to age. Ladene Artist, MD 06/27/2017 11:36:12 AM This report has been signed electronically.

## 2017-06-27 NOTE — Progress Notes (Signed)
Pt's states no medical or surgical changes since previsit or office visit. 

## 2017-06-27 NOTE — Progress Notes (Signed)
Report to PACU, RN, vss, BBS= Clear.  

## 2017-06-28 ENCOUNTER — Telehealth: Payer: Self-pay | Admitting: *Deleted

## 2017-06-28 ENCOUNTER — Telehealth: Payer: Self-pay

## 2017-06-28 NOTE — Telephone Encounter (Signed)
Left message on f/u call 

## 2017-06-28 NOTE — Telephone Encounter (Signed)
  Follow up Call-  Call Charisa Twitty number 06/27/2017  Post procedure Call Pamelia Botto phone  # [phone]  Permission to leave phone message Yes  Some recent data might be hidden     Patient questions:  Do you have a fever, pain , or abdominal swelling? No. Pain Score  0 *  Have you tolerated food without any problems? Yes.    Have you been able to return to your normal activities? Yes.    Do you have any questions about your discharge instructions: Diet   No. Medications  No. Follow up visit  No.  Do you have questions or concerns about your Care? No.  Actions: * If pain score is 4 or above: No action needed, pain <4.

## 2017-07-05 ENCOUNTER — Encounter: Payer: Self-pay | Admitting: Gastroenterology

## 2018-08-28 ENCOUNTER — Ambulatory Visit: Payer: Medicare Other | Admitting: Podiatry

## 2018-08-28 ENCOUNTER — Encounter: Payer: Self-pay | Admitting: Podiatry

## 2018-08-28 ENCOUNTER — Other Ambulatory Visit: Payer: Self-pay

## 2018-08-28 VITALS — BP 127/71 | HR 83 | Temp 98.1°F

## 2018-08-28 DIAGNOSIS — M79675 Pain in left toe(s): Secondary | ICD-10-CM

## 2018-08-28 DIAGNOSIS — B351 Tinea unguium: Secondary | ICD-10-CM | POA: Diagnosis not present

## 2018-08-28 DIAGNOSIS — M2012 Hallux valgus (acquired), left foot: Secondary | ICD-10-CM | POA: Diagnosis not present

## 2018-08-28 DIAGNOSIS — M79674 Pain in right toe(s): Secondary | ICD-10-CM | POA: Diagnosis not present

## 2018-08-28 NOTE — Patient Instructions (Signed)

## 2018-08-30 NOTE — Progress Notes (Signed)
Subjective: Valerie Fox presents today referred by Lawerance Cruel, MD with cc of painful, discolored, thick toenails which interfere with daily activities.  They have been present greater than several months. Pain is aggravated when wearing enclosed shoe gear. She has attempted self trimming in the past.   Has h/o corrected bunion deformity right foot. She has bunion left foot which is not painful.  Past Medical History:  Diagnosis Date  . Adenomatous colon polyp 01/2003  . Anxiety   . Arthritis   . Cataract   . Depression   . Diverticulitis, colon   . GERD (gastroesophageal reflux disease)   . Hypertension   . Liver cyst      Patient Active Problem List   Diagnosis Date Noted  . Esophageal reflux 02/16/2011  . Personal history of colonic polyps 02/16/2011     Past Surgical History:  Procedure Laterality Date  . ABDOMINAL HYSTERECTOMY    . BREAST SURGERY     benign breast tumors removed x3  . OOPHORECTOMY    . TONSILLECTOMY      Medications reviewed.  Allergies  Allergen Reactions  . Aspirin Anaphylaxis    Nsaids, ibuprofen Nsaids, ibuprofen   . Celebrex [Celecoxib] Anaphylaxis  . Ciprofloxacin Hcl Hives  . Flagyl [Metronidazole Hcl] Hives  . Nsaids Other (See Comments)    Because of reaction to celebrex.     Social History   Occupational History  . Not on file  Tobacco Use  . Smoking status: Never Smoker  . Smokeless tobacco: Never Used  Substance and Sexual Activity  . Alcohol use: Yes    Comment: occasional  . Drug use: No  . Sexual activity: Not on file     Family History  Problem Relation Age of Onset  . Breast cancer Sister   . Diabetes Other        granddaughter  . Heart disease Mother   . Heart disease Father   . Colon cancer Neg Hx   . Stomach cancer Neg Hx   . Esophageal cancer Neg Hx       There is no immunization history on file for this patient.   Review of systems: Positive Findings in bold print.  Constitutional:   chills, fatigue, fever, sweats, weight change Communication: Optometrist, sign Ecologist, hand writing, iPad/Android device Head: headaches, head injury Eyes: changes in vision, eye pain, glaucoma, cataracts, macular degeneration, diplopia, glare,  light sensitivity, eyeglasses or contacts, blindness Ears nose mouth throat: hearing impaired, hearing aids,  ringing in ears, deaf, sign language,  vertigo,   nosebleeds,  rhinitis,  cold sores, snoring, swollen glands Cardiovascular: HTN, edema, arrhythmia, pacemaker in place, defibrillator in place, chest pain/tightness, chronic anticoagulation, blood clot, heart failure, MI Peripheral Vascular: leg cramps, varicose veins, blood clots, lymphedema, varicosities Respiratory:  difficulty breathing, denies congestion, SOB, wheezing, cough, emphysema Gastrointestinal: change in appetite or weight, abdominal pain, constipation, diarrhea, nausea, vomiting, vomiting blood, change in bowel habits, abdominal pain, jaundice, rectal bleeding, hemorrhoids, GERD Genitourinary:  nocturia,  pain on urination, polyuria,  blood in urine, Foley catheter, urinary urgency, ESRD on hemodialysis Musculoskeletal: amputation, cramping, stiff joints, painful joints, decreased joint motion, fractures, OA, gout, hemiplegia, paraplegia, uses cane, wheelchair bound, uses walker, uses rollator Skin: +changes in toenails, color change, dryness, itching, mole changes,  rash, wound(s) Neurological: headaches, numbness in feet, paresthesias in feet, burning in feet, fainting,  seizures, change in speech. denies headaches, memory problems/poor historian, cerebral palsy, weakness, paralysis, CVA, TIA Endocrine: diabetes, hypothyroidism,  hyperthyroidism,  goiter, dry mouth, flushing, heat intolerance,  cold intolerance,  excessive thirst, denies polyuria,  nocturia Hematological:  easy bleeding, excessive bleeding, easy bruising, enlarged lymph nodes, on long term blood thinner,  history of past transusions Allergy/immunological:  hives, eczema, frequent infections, multiple drug allergies, seasonal allergies, transplant recipient, multiple food allergies Psychiatric:  anxiety, depression, mood disorder, suicidal ideations, hallucinations, insomnia  Objective: Vitals:   08/28/18 1538  BP: 127/71  Pulse: 83  Temp: 98.1 F (36.7 C)    Vascular Examination: Capillary refill time immediate x 10 digits.  Dorsalis pedis pulses palpable b/l.   Posterior tibial pulses palpable b/l.   No digital hair x 10 digits.  Skin temperature gradient WNL b/l.  Dermatological Examination: Skin with normal turgor, texture and tone b/l.  Toenails 1-5 b/l discolored, thick, dystrophic with subungual debris and pain with palpation to nailbeds due to thickness of nails.  Musculoskeletal: Muscle strength 5/5 to all LE muscle groups.  HAV with bunion left foot.  Neurological: Sensation intact with 10 gram monofilament.  Vibratory sensation intact.  Assessment: 1. Painful onychomycosis toenails 1-5 b/l  2. HAV with bunion b/l  Plan: 1. Discussed onychomycosis and treatment options.  Literature dispensed on today. Rx written for nonformulary compounding topical antifungal: [facility]: Antifungal cream - Terbinafine 3%, Fluconazole 2%, Tea Tree Oil 5%, Urea 10%, Ibuprofen 2% in DMSO Suspension #54ml. Apply to the affected nail(s) at bedtime. 2. Toenails 1-5 b/l were debrided in length and girth without iatrogenic bleeding. 3. Patient to continue soft, supportive shoe gear daily. 4. Patient to report any pedal injuries to medical professional immediately. 5. Follow up 3 months.  6. Patient/POA to call should there be a concern in the interim.

## 2018-10-26 ENCOUNTER — Emergency Department (HOSPITAL_COMMUNITY): Payer: Medicare Other

## 2018-10-26 ENCOUNTER — Other Ambulatory Visit: Payer: Self-pay

## 2018-10-26 ENCOUNTER — Encounter (HOSPITAL_COMMUNITY): Payer: Self-pay | Admitting: Emergency Medicine

## 2018-10-26 ENCOUNTER — Emergency Department (HOSPITAL_COMMUNITY)
Admission: EM | Admit: 2018-10-26 | Discharge: 2018-10-26 | Disposition: A | Discharge status: Home / Self Care | Payer: Medicare Other | Attending: Emergency Medicine | Admitting: Emergency Medicine

## 2018-10-26 DIAGNOSIS — I4891 Unspecified atrial fibrillation: Secondary | ICD-10-CM | POA: Insufficient documentation

## 2018-10-26 DIAGNOSIS — I10 Essential (primary) hypertension: Secondary | ICD-10-CM | POA: Diagnosis not present

## 2018-10-26 DIAGNOSIS — R4182 Altered mental status, unspecified: Secondary | ICD-10-CM | POA: Diagnosis present

## 2018-10-26 DIAGNOSIS — R410 Disorientation, unspecified: Secondary | ICD-10-CM | POA: Insufficient documentation

## 2018-10-26 DIAGNOSIS — R51 Headache: Secondary | ICD-10-CM | POA: Insufficient documentation

## 2018-10-26 DIAGNOSIS — R519 Headache, unspecified: Secondary | ICD-10-CM

## 2018-10-26 DIAGNOSIS — Z79899 Other long term (current) drug therapy: Secondary | ICD-10-CM | POA: Diagnosis not present

## 2018-10-26 HISTORY — DX: Diverticulitis of intestine, part unspecified, without perforation or abscess without bleeding: K57.92

## 2018-10-26 LAB — URINALYSIS, ROUTINE W REFLEX MICROSCOPIC
Bilirubin Urine: NEGATIVE
Glucose, UA: NEGATIVE mg/dL
Hgb urine dipstick: NEGATIVE
Ketones, ur: NEGATIVE mg/dL
Leukocytes,Ua: NEGATIVE
Nitrite: NEGATIVE
Protein, ur: NEGATIVE mg/dL
Specific Gravity, Urine: 1.004 — ABNORMAL LOW (ref 1.005–1.030)
pH: 7 (ref 5.0–8.0)

## 2018-10-26 LAB — CBC WITH DIFFERENTIAL/PLATELET
Abs Immature Granulocytes: 0.02 10*3/uL (ref 0.00–0.07)
Basophils Absolute: 0 10*3/uL (ref 0.0–0.1)
Basophils Relative: 0 %
Eosinophils Absolute: 0.1 10*3/uL (ref 0.0–0.5)
Eosinophils Relative: 1 %
HCT: 40.7 % (ref 36.0–46.0)
Hemoglobin: 14.1 g/dL (ref 12.0–15.0)
Immature Granulocytes: 0 %
Lymphocytes Relative: 24 %
Lymphs Abs: 1.8 10*3/uL (ref 0.7–4.0)
MCH: 31.2 pg (ref 26.0–34.0)
MCHC: 34.6 g/dL (ref 30.0–36.0)
MCV: 90 fL (ref 80.0–100.0)
Monocytes Absolute: 0.8 10*3/uL (ref 0.1–1.0)
Monocytes Relative: 11 %
Neutro Abs: 4.8 10*3/uL (ref 1.7–7.7)
Neutrophils Relative %: 64 %
Platelets: 261 10*3/uL (ref 150–400)
RBC: 4.52 MIL/uL (ref 3.87–5.11)
RDW: 12.4 % (ref 11.5–15.5)
WBC: 7.5 10*3/uL (ref 4.0–10.5)
nRBC: 0 % (ref 0.0–0.2)

## 2018-10-26 LAB — COMPREHENSIVE METABOLIC PANEL
ALT: 21 U/L (ref 0–44)
AST: 24 U/L (ref 15–41)
Albumin: 3.8 g/dL (ref 3.5–5.0)
Alkaline Phosphatase: 44 U/L (ref 38–126)
Anion gap: 10 (ref 5–15)
BUN: 18 mg/dL (ref 8–23)
CO2: 27 mmol/L (ref 22–32)
Calcium: 10.1 mg/dL (ref 8.9–10.3)
Chloride: 106 mmol/L (ref 98–111)
Creatinine, Ser: 1.08 mg/dL — ABNORMAL HIGH (ref 0.44–1.00)
GFR calc Af Amer: 56 mL/min — ABNORMAL LOW (ref >60)
GFR calc non Af Amer: 48 mL/min — ABNORMAL LOW (ref >60)
Glucose, Bld: 84 mg/dL (ref 70–99)
Potassium: 3.6 mmol/L (ref 3.5–5.1)
Sodium: 143 mmol/L (ref 135–145)
Total Bilirubin: 0.4 mg/dL (ref 0.3–1.2)
Total Protein: 5.8 g/dL — ABNORMAL LOW (ref 6.5–8.1)

## 2018-10-26 MED ORDER — IOHEXOL 350 MG/ML SOLN
100.0000 mL | Freq: Once | INTRAVENOUS | Status: AC | PRN
  Administered 2018-10-26: 19:00:00 100 mL via INTRAVENOUS

## 2018-10-26 NOTE — ED Notes (Signed)
Pt is AOx4, answering all questions correctly. Pt still does not remember when her husband came home or where he went.

## 2018-10-26 NOTE — ED Triage Notes (Signed)
Pt BIB ems for sudden confusion. Per EMS pt was LSN at 1100 when her husband left to go to the grocery store. While husband was gone, pt forgot where he was and husband returned home to find pt confused. AOx3. Hx of a-fib, VSS.

## 2018-10-26 NOTE — ED Provider Notes (Signed)
CT is negative. Labs and UA are reassuring. Discussed with Dr. Cheral Marker. He feels symptoms are atypical for TIA and more likely we would need to r/o a sentinel bleed with CTA of head and neck. Discussed with family who are aggreable.  CTA head and neck are negative. Pt is back to baseline and good social support. Will d/c. Return precautions discussed     Recardo Evangelist, PA-C 10/26/18 2016    Maudie Flakes, MD 10/26/18 [phone]

## 2018-10-26 NOTE — Discharge Instructions (Signed)
Please followup with your doctor.    Return for any worsening symptoms.

## 2018-10-26 NOTE — ED Notes (Signed)
Patient requested(Turkey Sandwich/Ginger Ale) given-per Ruthann Cancer, PA given by Levada Dy

## 2018-10-26 NOTE — ED Provider Notes (Signed)
[facility] EMERGENCY DEPARTMENT Provider Note   CSN: [id number] Arrival date & time: 10/26/18  1308     History   Chief Complaint Chief Complaint  Patient presents with  . Altered Mental Status    HPI Valerie Fox is a 84 y.o. female with a past medical history significant for depression, hypertension, diverticulitis, anxiety, and PVCs who presents to the ED on 9/25 via EMS due to a sudden onset of altered mental status for roughly 2.5 hours. Her last seen normal was around 11am when her husband left for the grocery store. Patient admits she had a severe headache located on the top of her head prior to the confusion. Husband returned from grocery store to find patient extremely confused about where he went and forgot about making the grocery list together. Patient does not have a history of TIAs or stroke. She does have a history of chronic UTIs, but denies current UTI symptoms. Patient admits to feeling extremely fatigued over the past month which she attributes to depression due to the pandemic and having to stay at home. Patient occasionally drinks alcohol, but denies drug usage. She denies chest pain, shortness of breath, weakness, and dysuria.  Past Medical History:  Diagnosis Date  . Adenomatous colon polyp 01/2003  . Anxiety   . Arthritis   . Atrial fibrillation ([place])   . Cataract   . Depression   . Diverticulitis   . Diverticulitis, colon   . GERD (gastroesophageal reflux disease)   . Hypertension   . Liver cyst     Patient Active Problem List   Diagnosis Date Noted  . Esophageal reflux 02/16/2011  . Personal history of colonic polyps 02/16/2011    Past Surgical History:  Procedure Laterality Date  . ABDOMINAL HYSTERECTOMY    . BREAST SURGERY     benign breast tumors removed x3  . OOPHORECTOMY    . TONSILLECTOMY       OB History   No obstetric history on file.      Home Medications    Prior to Admission medications   Medication Sig  Start Date End Date Taking? Authorizing Provider  AMBULATORY NON FORMULARY MEDICATION Medication Name: fiber capsule-2 capsule daily    [provider]  B Complex Vitamins (VITAMIN B-COMPLEX) TABS Take by mouth.    [provider]  BIOTIN PO Take 5,000 mcg by mouth daily.     [provider]  citalopram (CELEXA) 20 MG tablet Take 20 mg by mouth daily.      [provider]  Cranberry 400 MG CAPS Take 1 capsule by mouth 2 (two) times daily.    [provider]  Cyanocobalamin (VITAMIN B-12) 2500 MCG SUBL Place 2 tablets under the tongue daily.    [provider]  fluticasone (FLONASE) 50 MCG/ACT nasal spray Place into the nose. 09/17/13   [provider]  Glucosamine HCl (GLUCOSAMINE PO) Take 1 capsule by mouth daily.    [provider]  Glucosamine HCl 1500 MG TABS Take by mouth.    [provider]  lisinopril-hydrochlorothiazide (PRINZIDE,ZESTORETIC) 20-12.5 MG per tablet Take 1 tablet by mouth daily.      [provider]  Multiple Vitamin (MULTIVITAMIN) tablet Take by mouth.    [provider]  Multiple Vitamins-Minerals (ICAPS AREDS 2 PO) Take 1 capsule by mouth 2 (two) times daily.    [provider]  nitrofurantoin, macrocrystal-monohydrate, (MACROBID) 100 MG capsule TAKE ONE CAPSULE (100 MG DOSE) BY MOUTH  2 (TWO) TIMES DAILY FOR 3 DAYS. 08/14/18   [provider]  NON FORMULARY Antifungal nail solution from [facility], faxed 08/28/2018 [facility]    [provider]  Omega-3 1000 MG CAPS Take by mouth.    [provider]  Omega-3 Krill Oil 500 MG CAPS Take 1 capsule by mouth daily.      [provider]  Probiotic Product (PROBIOTIC PO) Take 1 capsule by mouth as needed.    [provider]  Turmeric 500 MG CAPS Take 1,000 mg by mouth daily.    [provider]    Family History Family History  Problem Relation Age of Onset  .  Breast cancer Sister   . Diabetes Other        granddaughter  . Heart disease Mother   . Heart disease Father   . Colon cancer Neg Hx   . Stomach cancer Neg Hx   . Esophageal cancer Neg Hx     Social History Social History   Tobacco Use  . Smoking status: Never Smoker  . Smokeless tobacco: Never Used  Substance Use Topics  . Alcohol use: Yes    Comment: occasional  . Drug use: No     Allergies   Aspirin, Celebrex [celecoxib], Ciprofloxacin hcl, Flagyl [metronidazole hcl], and Nsaids   Review of Systems Review of Systems  Constitutional: Positive for fatigue (extreme fatigue for the past month). Negative for chills and fever.  HENT: Negative for rhinorrhea and sore throat.   Eyes: Negative for visual disturbance.  Respiratory: Negative for shortness of breath.   Cardiovascular: Negative for chest pain and palpitations.  Gastrointestinal: Negative for abdominal pain.  Genitourinary: Negative for dysuria and hematuria.  Musculoskeletal: Negative for myalgias.  Skin: Negative for color change and rash.  Neurological: Positive for headaches (severe headache prior to confusion). Negative for dizziness, syncope, speech difficulty, weakness and light-headedness.  Psychiatric/Behavioral: Positive for confusion.     Physical Exam Updated Vital Signs BP 129/74   Pulse 74   Temp 98.4 F (36.9 C) (Oral)   Resp 16   SpO2 96%   Physical Exam  Constitutional: She is well-developed, well-nourished, and in no distress. No distress.  HENT:  Head: Normocephalic.  Eyes: Pupils are equal, round, and reactive to light.  Neck: Neck supple.  Cardiovascular: Normal rate, regular rhythm, normal heart sounds and intact distal pulses. Exam reveals no gallop and no friction rub.  No murmur heard. Pulmonary/Chest: Effort normal and breath sounds normal. She has no wheezes. She has no rales.  Abdominal: Soft. She exhibits no distension. There is no abdominal tenderness.   Musculoskeletal: Normal range of motion.        General: No edema.  Skin: Skin is warm. No rash noted.  Psychiatric: Mood and affect normal.  Neurological: Mental Status: Alert, oriented, thought content appropriate. Speech fluent without evidence of aphasia. Able to follow 2 step commands without difficulty.  Cranial Nerves:  III,IV, VI: pupils equal, round, reactive to light, ptosis not present, extra-ocular motions intact bilaterally  V,VII: smile symmetric, facial light touch sensation equal VIII: hearing grossly normal bilaterally  IX,X: midline uvula rise  XI: bilateral shoulder shrug equal and strong XII: midline tongue extension  Motor:  5/5 in upper and lower extremities bilaterally including strong and equal grip strength and dorsiflexion/plantar flexion Sensory: Pinprick and light touch normal in all extremities.  Cerebellar: normal finger-to-nose with bilateral upper extremities Gait: normal gait and balance   ED Treatments / Results  Labs (all labs ordered are listed, but only abnormal results are displayed) Labs Reviewed  URINALYSIS, ROUTINE W REFLEX MICROSCOPIC - Abnormal; Notable for the following components:      Result Value   Color, Urine STRAW (*)    Specific Gravity, Urine 1.004 (*)    All other components within normal limits  COMPREHENSIVE METABOLIC PANEL - Abnormal; Notable for the following components:   Creatinine, Ser 1.08 (*)    Total Protein 5.8 (*)    GFR calc non Af Amer 48 (*)    GFR calc Af Amer 56 (*)    All other components within normal limits  CBC WITH DIFFERENTIAL/PLATELET    EKG EKG Interpretation  Date/Time:  Friday October 26 2018 13:19:14 EDT Ventricular Rate:  78 PR Interval:    QRS Duration: 99 QT Interval:  393 QTC Calculation: 448 R Axis:   -2 Text Interpretation:  Sinus rhythm No acute changes No old tracing to compare Confirmed by Varney Biles [phone]) on 10/26/2018 2:37:02 PM   Radiology Ct Head Wo Contrast   Result Date: 10/26/2018 CLINICAL DATA:  LSN at 8 when her husband left to go to the grocery store. While husband was gone, pt forgot where he was and husband returned home to find pt confused. EXAM: CT HEAD WITHOUT CONTRAST TECHNIQUE: Contiguous axial images were obtained from the base of the skull through the vertex without intravenous contrast. COMPARISON:  None. FINDINGS: Brain: No evidence of acute infarction, hemorrhage, hydrocephalus, extra-axial collection or mass lesion/mass effect. Mild global atrophy. Vascular: No hyperdense vessel or unexpected calcification. Skull: Normal. Negative for fracture or focal lesion. Sinuses/Orbits: Polypoid mucosal thickening in the left maxillary sinus. Other: None. IMPRESSION: No acute intracranial abnormality. Electronically Signed   By: Audie Pinto M.D.   On: 10/26/2018 16:25    Procedures Procedures (including critical care time)  Medications Ordered in ED Medications - No data to display   Initial Impression / Assessment and Plan / ED Course  I have reviewed the triage vital signs and the nursing notes.  Pertinent labs & imaging results that were available during my care of the patient were reviewed by me and considered in my medical decision making (see chart for details).  Clinical Course as of Oct 26 1703  Fri Oct 26, 2018  1410 Initial exam performed. Will order BMP, CBC, and UA. Will discuss with Dr. Kathrynn Humble about ordering noncontrast head CT   [CC]  1555 Labs reviewed. UA is negative for UTI, CMP unremarkable except for slight increase in creatinine and low total protein. No evidence of metabolic encephalopathy that could have caused the patient's altered mental status. Still waiting on head CT   [CC]    Clinical Course User Index [CC] Atilano Median Comer Locket, PA-C   Patient presents to ED via EMS due to AMS. CMP, UA, and CBC were ordered to check for metabolic etiologies of altered mental status. Noncontrast head CT will be ordered  to check for signs of stroke. If CT is positive for a hemorrhage, neurosurgery will be consulted. If head CT is negative, neurology will be consulted to further work up forTIA due to increased risk of stroke within the next 48 hours. Still awaiting head CT scan- Signed out to [facility] at [facility].   Final Clinical Impressions(s) / ED Diagnoses   Final diagnoses:  Disorientation  Sudden onset of severe headache    ED Discharge Orders    None      Jonette Eva, PA-C 10/26/18 1705  Varney Biles, MD 10/27/18 1316

## 2018-10-26 NOTE — ED Notes (Signed)
Patient verbalizes understanding of discharge instructions. Opportunity for questioning and answers were provided. Armband removed by staff, pt discharged from ED ambulatory to home.  

## 2018-11-27 ENCOUNTER — Other Ambulatory Visit: Payer: Self-pay

## 2018-11-27 DIAGNOSIS — Z20822 Contact with and (suspected) exposure to covid-19: Secondary | ICD-10-CM

## 2018-11-29 LAB — NOVEL CORONAVIRUS, NAA: SARS-CoV-2, NAA: NOT DETECTED

## 2018-11-30 ENCOUNTER — Ambulatory Visit: Payer: Medicare Other | Admitting: Podiatry

## 2020-05-13 IMAGING — CT CT ANGIO HEAD
2 of 7 series · 8 of 33 positions shown · IV contrast (APPLIED)
Comparison: Head CT earlier same day

CLINICAL DATA: Acute severe headache, worst of life.

EXAM:
CT ANGIOGRAPHY HEAD AND NECK
TECHNIQUE: Multidetector CT imaging of the head and neck was performed using
the standard protocol during bolus administration of intravenous
contrast. Multiplanar CT image reconstructions and MIPs were
obtained to evaluate the vascular anatomy. Carotid stenosis
measurements (when applicable) are obtained utilizing NASCET
criteria, using the distal internal carotid diameter as the
denominator.
CONTRAST:  100mL OMNIPAQUE IOHEXOL 350 MG/ML SOLN

[Series 5: cta neck/head · axial · 0.56mm/px · z∈[+194,+308]mm · 2 of 173 slices shown]
[im 58/173  soft-tissue]
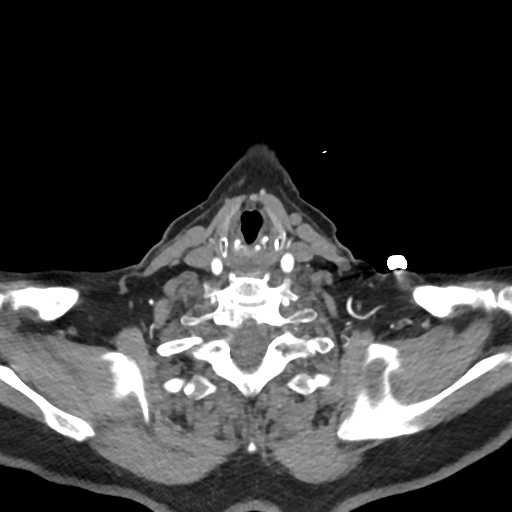
[im 115/173  bone]
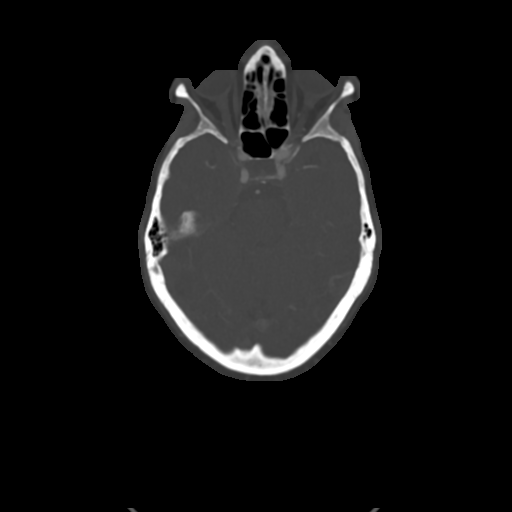

[Series 7: ax thins · axial · 0.39mm/px · z∈[+128,+368]mm · 6 of 338 slices shown]
[im 49/338  soft-tissue]
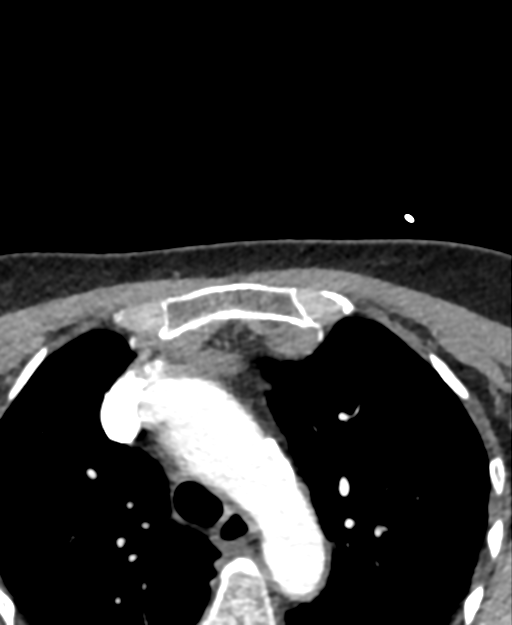
[im 97/338  soft-tissue]
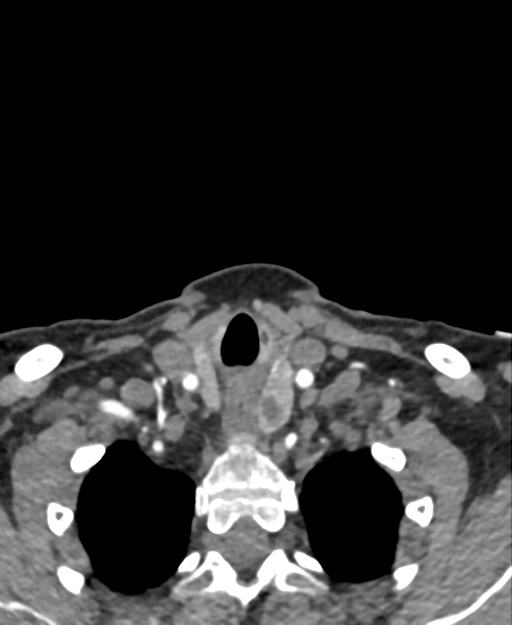
[im 145/338  soft-tissue]
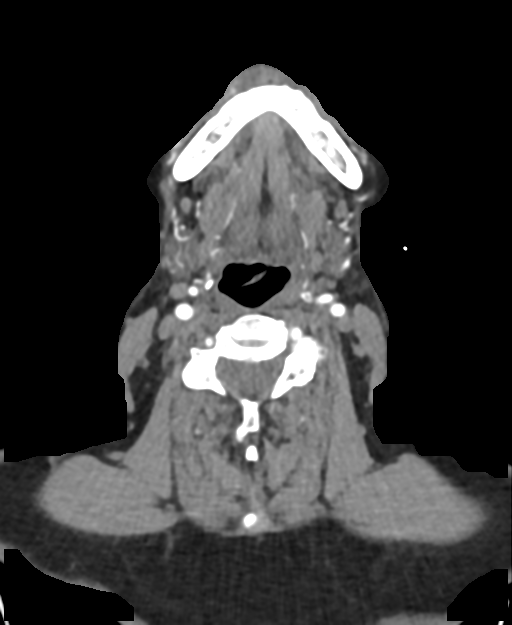
[im 193/338  soft-tissue]
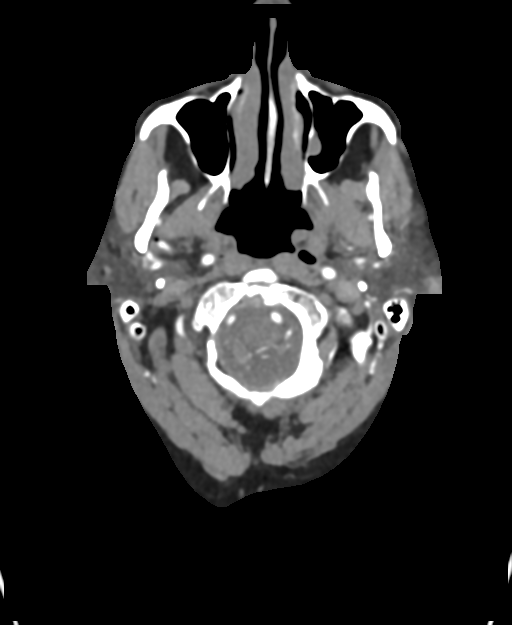
[im 241/338  soft-tissue]
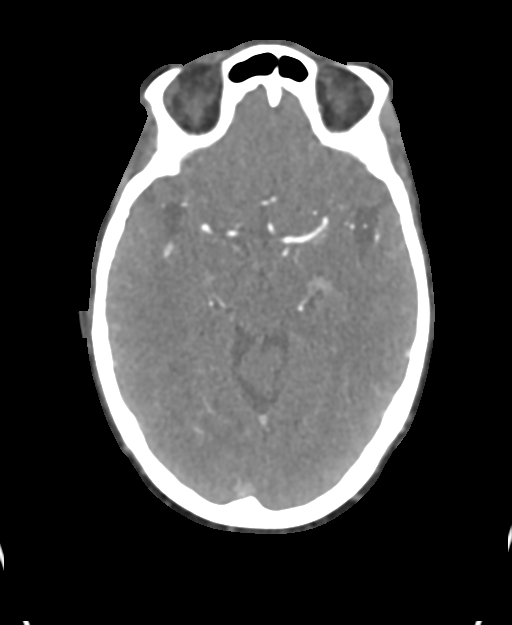
[im 289/338  soft-tissue]
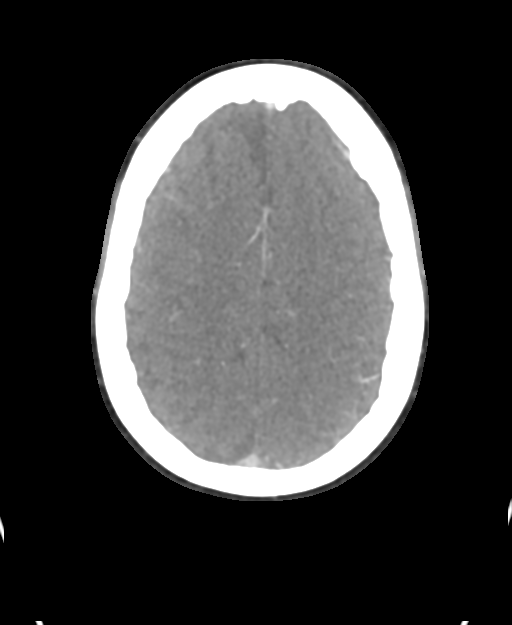

[8 of 33 positions shown; findings below may reference images not displayed]

FINDINGS: CTA NECK FINDINGS

Aortic arch: Aortic atherosclerosis. No aneurysm or dissection.
Branching pattern is normal without origin stenosis.

Right carotid system: Common carotid artery widely patent to the
bifurcation. Carotid bifurcation is normal without soft or calcified
plaque. Cervical ICA is tortuous but normal.

Left carotid system: Common carotid artery widely patent to the
bifurcation. Carotid bifurcation is normal without soft or calcified
plaque. Cervical ICA is tortuous but otherwise normal.

Vertebral arteries: Both vertebral arteries are widely patent at
their origins. The left vertebral artery is dominant. Both vertebral
arteries appear widely patent and normal through the cervical region
to the foramen magnum.

Skeleton: Mild cervical spondylosis.  No significant finding.

Other neck: No adenopathy. Multinodular thyroid gland most
consistent with goiter.

Upper chest: Minimal pulmonary scarring and emphysema. No active or
focal lesion otherwise.

Review of the MIP images confirms the above findings

CTA HEAD FINDINGS

Anterior circulation: Both internal carotid arteries are widely
patent through the siphon regions. The anterior and middle cerebral
vessels are normal without stenosis, aneurysm or vascular
malformation.

Posterior circulation: Both vertebral arteries are patent through
the foramen magnum. The left vertebral artery is dominant. No
basilar stenosis. Posterior circulation branch vessels are normal.
Large posterior communicating arteries.

Venous sinuses: Patent and normal.

Anatomic variants: None

Review of the MIP images confirms the above findings
IMPRESSION: The vessels are tortuous as might be seen with a history of
hypertension. Patient does not have much atherosclerotic vascular
disease however. No plaque at either carotid bifurcation. No
intracranial atherosclerotic disease. No intracranial aneurysm or
other vascular finding to explain severe headache.

## 2020-09-05 DIAGNOSIS — R1084 Generalized abdominal pain: Secondary | ICD-10-CM | POA: Diagnosis not present

## 2020-09-05 DIAGNOSIS — R350 Frequency of micturition: Secondary | ICD-10-CM | POA: Diagnosis not present

## 2020-09-05 DIAGNOSIS — R051 Acute cough: Secondary | ICD-10-CM | POA: Diagnosis not present

## 2020-09-05 DIAGNOSIS — N3001 Acute cystitis with hematuria: Secondary | ICD-10-CM | POA: Diagnosis not present

## 2020-09-05 DIAGNOSIS — J069 Acute upper respiratory infection, unspecified: Secondary | ICD-10-CM | POA: Diagnosis not present

## 2020-09-05 DIAGNOSIS — R5383 Other fatigue: Secondary | ICD-10-CM | POA: Diagnosis not present

## 2020-09-05 DIAGNOSIS — Z03818 Encounter for observation for suspected exposure to other biological agents ruled out: Secondary | ICD-10-CM | POA: Diagnosis not present

## 2020-09-27 DIAGNOSIS — R300 Dysuria: Secondary | ICD-10-CM | POA: Diagnosis not present

## 2020-10-06 DIAGNOSIS — N390 Urinary tract infection, site not specified: Secondary | ICD-10-CM | POA: Diagnosis not present

## 2020-10-20 DIAGNOSIS — Z961 Presence of intraocular lens: Secondary | ICD-10-CM | POA: Diagnosis not present

## 2020-10-20 DIAGNOSIS — H04123 Dry eye syndrome of bilateral lacrimal glands: Secondary | ICD-10-CM | POA: Diagnosis not present

## 2020-10-20 DIAGNOSIS — H02403 Unspecified ptosis of bilateral eyelids: Secondary | ICD-10-CM | POA: Diagnosis not present

## 2020-10-20 DIAGNOSIS — H35342 Macular cyst, hole, or pseudohole, left eye: Secondary | ICD-10-CM | POA: Diagnosis not present

## 2020-11-24 DIAGNOSIS — L74519 Primary focal hyperhidrosis, unspecified: Secondary | ICD-10-CM | POA: Diagnosis not present

## 2020-11-24 DIAGNOSIS — E782 Mixed hyperlipidemia: Secondary | ICD-10-CM | POA: Diagnosis not present

## 2020-11-24 DIAGNOSIS — Z20822 Contact with and (suspected) exposure to covid-19: Secondary | ICD-10-CM | POA: Diagnosis not present

## 2020-11-24 DIAGNOSIS — M8588 Other specified disorders of bone density and structure, other site: Secondary | ICD-10-CM | POA: Diagnosis not present

## 2020-11-24 DIAGNOSIS — M19041 Primary osteoarthritis, right hand: Secondary | ICD-10-CM | POA: Diagnosis not present

## 2020-11-24 DIAGNOSIS — K219 Gastro-esophageal reflux disease without esophagitis: Secondary | ICD-10-CM | POA: Diagnosis not present

## 2020-11-24 DIAGNOSIS — N390 Urinary tract infection, site not specified: Secondary | ICD-10-CM | POA: Diagnosis not present

## 2020-11-24 DIAGNOSIS — R5383 Other fatigue: Secondary | ICD-10-CM | POA: Diagnosis not present

## 2020-11-24 DIAGNOSIS — I10 Essential (primary) hypertension: Secondary | ICD-10-CM | POA: Diagnosis not present

## 2020-12-01 DIAGNOSIS — N390 Urinary tract infection, site not specified: Secondary | ICD-10-CM | POA: Diagnosis not present

## 2020-12-01 DIAGNOSIS — L74519 Primary focal hyperhidrosis, unspecified: Secondary | ICD-10-CM | POA: Diagnosis not present

## 2020-12-01 DIAGNOSIS — N1831 Chronic kidney disease, stage 3a: Secondary | ICD-10-CM | POA: Diagnosis not present

## 2020-12-01 DIAGNOSIS — I10 Essential (primary) hypertension: Secondary | ICD-10-CM | POA: Diagnosis not present

## 2020-12-01 DIAGNOSIS — Z0000 Encounter for general adult medical examination without abnormal findings: Secondary | ICD-10-CM | POA: Diagnosis not present

## 2020-12-01 DIAGNOSIS — E782 Mixed hyperlipidemia: Secondary | ICD-10-CM | POA: Diagnosis not present

## 2021-01-01 DIAGNOSIS — M810 Age-related osteoporosis without current pathological fracture: Secondary | ICD-10-CM | POA: Diagnosis not present

## 2021-01-01 DIAGNOSIS — Z803 Family history of malignant neoplasm of breast: Secondary | ICD-10-CM | POA: Diagnosis not present

## 2021-01-01 DIAGNOSIS — Z1231 Encounter for screening mammogram for malignant neoplasm of breast: Secondary | ICD-10-CM | POA: Diagnosis not present

## 2021-01-01 DIAGNOSIS — Z780 Asymptomatic menopausal state: Secondary | ICD-10-CM | POA: Diagnosis not present

## 2021-01-01 DIAGNOSIS — M85851 Other specified disorders of bone density and structure, right thigh: Secondary | ICD-10-CM | POA: Diagnosis not present

## 2021-02-02 ENCOUNTER — Emergency Department (HOSPITAL_BASED_OUTPATIENT_CLINIC_OR_DEPARTMENT_OTHER)
Admission: EM | Admit: 2021-02-02 | Discharge: 2021-02-03 | Disposition: A | Discharge status: Home / Self Care | Payer: Medicare Other | Attending: Emergency Medicine | Admitting: Emergency Medicine

## 2021-02-02 ENCOUNTER — Other Ambulatory Visit: Payer: Self-pay

## 2021-02-02 ENCOUNTER — Encounter (HOSPITAL_BASED_OUTPATIENT_CLINIC_OR_DEPARTMENT_OTHER): Payer: Self-pay

## 2021-02-02 DIAGNOSIS — Z79899 Other long term (current) drug therapy: Secondary | ICD-10-CM | POA: Insufficient documentation

## 2021-02-02 DIAGNOSIS — T161XXA Foreign body in right ear, initial encounter: Secondary | ICD-10-CM | POA: Insufficient documentation

## 2021-02-02 DIAGNOSIS — W458XXA Other foreign body or object entering through skin, initial encounter: Secondary | ICD-10-CM | POA: Insufficient documentation

## 2021-02-02 NOTE — ED Provider Notes (Signed)
[facility] EMERGENCY DEPT Provider Note   CSN: [id number] Arrival date & time: 02/02/21  2002     History  Chief Complaint  Patient presents with   Foreign Body in [place] is a 87 y.o. female.  Patient presents with complaints of a foreign body in her right ear.  Patient reports that she removed the hearing aid from her right ear earlier tonight and the rubber piece stayed in her ear.  Patient reports mild pain of the right ear, no bleeding or discharge.      Home Medications Prior to Admission medications   Medication Sig Start Date End Date Taking? Authorizing Provider  AMBULATORY NON FORMULARY MEDICATION Medication Name: fiber capsule-2 capsule daily    [provider]  B Complex Vitamins (VITAMIN B-COMPLEX) TABS Take by mouth.    [provider]  BIOTIN PO Take 5,000 mcg by mouth daily.     [provider]  citalopram (CELEXA) 20 MG tablet Take 20 mg by mouth daily.      [provider]  Cranberry 400 MG CAPS Take 1 capsule by mouth 2 (two) times daily.    [provider]  Cyanocobalamin (VITAMIN B-12) 2500 MCG SUBL Place 2 tablets under the tongue daily.    [provider]  fluticasone (FLONASE) 50 MCG/ACT nasal spray Place into the nose. 09/17/13   [provider]  Glucosamine HCl (GLUCOSAMINE PO) Take 1 capsule by mouth daily.    [provider]  Glucosamine HCl 1500 MG TABS Take by mouth.    [provider]  lisinopril-hydrochlorothiazide (PRINZIDE,ZESTORETIC) 20-12.5 MG per tablet Take 1 tablet by mouth daily.      [provider]  Multiple Vitamin (MULTIVITAMIN) tablet Take by mouth.    [provider]  Multiple Vitamins-Minerals (ICAPS AREDS 2 PO) Take 1 capsule by mouth 2 (two) times daily.    [provider]  nitrofurantoin, macrocrystal-monohydrate, (MACROBID) 100 MG capsule TAKE ONE CAPSULE (100 MG DOSE) BY MOUTH 2 (TWO) TIMES DAILY  FOR 3 DAYS. 08/14/18   [provider]  NON FORMULARY Antifungal nail solution from [facility], faxed 08/28/2018 [facility]    [provider]  Omega-3 1000 MG CAPS Take by mouth.    [provider]  Omega-3 Krill Oil 500 MG CAPS Take 1 capsule by mouth daily.      [provider]  Probiotic Product (PROBIOTIC PO) Take 1 capsule by mouth as needed.    [provider]  Turmeric 500 MG CAPS Take 1,000 mg by mouth daily.    [provider]      Allergies    Aspirin, Celebrex [celecoxib], Ciprofloxacin hcl, Flagyl [metronidazole hcl], and Nsaids    Review of Systems   Review of Systems  Constitutional:  Negative for fever.  HENT:  Positive for ear pain.    Physical Exam Updated Vital Signs BP (!) 129/96 (BP Location: Right Arm)    Pulse 88    Temp 98.3 F (36.8 C) (Oral)    Resp 16    Ht 5\' 3"  (1.6 m)    Wt 81.2 kg    SpO2 98%    BMI 31.71 kg/m  Physical Exam Constitutional:      Appearance: Normal appearance.  HENT:     Right Ear: No laceration, drainage or swelling. A foreign body is present. Tympanic membrane is not injected or perforated.  Neurological:     Mental Status: She is alert.  ED Results / Procedures / Treatments   Labs (all labs ordered are listed, but only abnormal results are displayed) Labs Reviewed - No data to display  EKG None  Radiology No results found.  Procedures .Foreign Body Removal  Date/Time: 02/02/2021 11:31 PM Performed by: Orpah Greek, MD Authorized by: Orpah Greek, MD  Consent: Verbal consent obtained. Risks and benefits: risks, benefits and alternatives were discussed Consent given by: patient Patient understanding: patient states understanding of the procedure being performed Patient consent: the patient's understanding of the procedure matches consent given Procedure consent: procedure consent matches procedure scheduled Site marked: the operative site  was marked Required items: required blood products, implants, devices, and special equipment available Patient identity confirmed: verbally with patient Time out: Immediately prior to procedure a "time out" was called to verify the correct patient, procedure, equipment, support staff and site/side marked as required. Body area: ear Location details: right ear  Sedation: Patient sedated: no  Patient restrained: no Patient cooperative: yes Localization method: ENT speculum Removal mechanism: forceps Complexity: simple 1 objects recovered. Objects recovered: rubber ear piece Post-procedure assessment: foreign body removed Patient tolerance: patient tolerated the procedure well with no immediate complications     Medications Ordered in ED Medications - No data to display  ED Course/ Medical Decision Making/ A&P                           Medical Decision Making  Presented with a foreign body in the right ear.  This was localized visually and then removed with a forcep.  No trauma occurred with the removal.  Tympanic membrane intact.        Final Clinical Impression(s) / ED Diagnoses Final diagnoses:  Foreign body of right ear, initial encounter    Rx / DC Orders ED Discharge Orders     None         Alawna Graybeal, Gwenyth Allegra, MD 02/02/21 2332

## 2021-02-02 NOTE — ED Notes (Signed)
Pt standing in hallway insistent on leaving before last set of vital signs

## 2021-02-02 NOTE — ED Triage Notes (Signed)
Patient here with FB in Right Ear.   Patient has Plastic Portion of Hearing Aid lodged in Right Ear Canal. No Success at Home in removing FB.  NAD Noted during Triage. A&Ox4. GCS 15. Ambulatory.

## 2021-02-03 NOTE — ED Notes (Signed)
Pt verbalizes understanding of discharge instructions. Opportunity for questioning and answers were provided. Pt discharged from ED to home.   ? ?

## 2021-02-26 DIAGNOSIS — M545 Low back pain, unspecified: Secondary | ICD-10-CM | POA: Diagnosis not present

## 2021-02-26 DIAGNOSIS — N3000 Acute cystitis without hematuria: Secondary | ICD-10-CM | POA: Diagnosis not present

## 2021-06-17 DIAGNOSIS — N3001 Acute cystitis with hematuria: Secondary | ICD-10-CM | POA: Diagnosis not present

## 2021-06-17 DIAGNOSIS — R300 Dysuria: Secondary | ICD-10-CM | POA: Diagnosis not present

## 2021-06-28 DIAGNOSIS — N390 Urinary tract infection, site not specified: Secondary | ICD-10-CM | POA: Diagnosis not present

## 2021-07-07 DIAGNOSIS — N390 Urinary tract infection, site not specified: Secondary | ICD-10-CM | POA: Diagnosis not present

## 2021-07-07 DIAGNOSIS — M79606 Pain in leg, unspecified: Secondary | ICD-10-CM | POA: Diagnosis not present

## 2021-07-19 DIAGNOSIS — L578 Other skin changes due to chronic exposure to nonionizing radiation: Secondary | ICD-10-CM | POA: Diagnosis not present

## 2021-07-19 DIAGNOSIS — L03019 Cellulitis of unspecified finger: Secondary | ICD-10-CM | POA: Diagnosis not present

## 2021-07-19 DIAGNOSIS — L738 Other specified follicular disorders: Secondary | ICD-10-CM | POA: Diagnosis not present

## 2021-07-19 DIAGNOSIS — L720 Epidermal cyst: Secondary | ICD-10-CM | POA: Diagnosis not present

## 2021-07-29 DIAGNOSIS — L03012 Cellulitis of left finger: Secondary | ICD-10-CM | POA: Diagnosis not present

## 2021-08-02 DIAGNOSIS — L03012 Cellulitis of left finger: Secondary | ICD-10-CM | POA: Diagnosis not present

## 2021-08-02 DIAGNOSIS — M19042 Primary osteoarthritis, left hand: Secondary | ICD-10-CM | POA: Diagnosis not present

## 2021-08-30 DIAGNOSIS — K5792 Diverticulitis of intestine, part unspecified, without perforation or abscess without bleeding: Secondary | ICD-10-CM | POA: Diagnosis not present

## 2021-09-17 ENCOUNTER — Telehealth: Payer: Self-pay | Admitting: Physician Assistant

## 2021-09-17 DIAGNOSIS — R197 Diarrhea, unspecified: Secondary | ICD-10-CM | POA: Diagnosis not present

## 2021-09-17 DIAGNOSIS — R1084 Generalized abdominal pain: Secondary | ICD-10-CM | POA: Diagnosis not present

## 2021-09-17 NOTE — Telephone Encounter (Signed)
Returned call to patient. I informed her that I am not able to provide recommendations at this time since she has not been seen since 2019 and I do not know what is causing her pain. I advised pt that if she was in severe pain she should go to urgent care or ED for evaluation. Pt states that she will try to go to the [facility] walk-in clinic. Pt verbalized understanding and had no concerns at the end of the call.

## 2021-09-17 NOTE — Telephone Encounter (Signed)
PT is experiencing extreme upper abdomen pain for about past 20 mins and very concerned. Wants to know what she can do to get relief. Please advise. Thank you

## 2021-09-21 ENCOUNTER — Other Ambulatory Visit: Payer: Self-pay | Admitting: Family Medicine

## 2021-09-21 DIAGNOSIS — R109 Unspecified abdominal pain: Secondary | ICD-10-CM

## 2021-09-23 ENCOUNTER — Ambulatory Visit
Admission: RE | Admit: 2021-09-23 | Discharge: 2021-09-23 | Disposition: A | Discharge status: Home / Self Care | Payer: Medicare Other | Source: Ambulatory Visit | Attending: Family Medicine | Admitting: Family Medicine

## 2021-09-23 DIAGNOSIS — N281 Cyst of kidney, acquired: Secondary | ICD-10-CM | POA: Diagnosis not present

## 2021-09-23 DIAGNOSIS — K7689 Other specified diseases of liver: Secondary | ICD-10-CM | POA: Diagnosis not present

## 2021-09-23 DIAGNOSIS — R109 Unspecified abdominal pain: Secondary | ICD-10-CM

## 2021-09-23 DIAGNOSIS — K8020 Calculus of gallbladder without cholecystitis without obstruction: Secondary | ICD-10-CM | POA: Diagnosis not present

## 2021-09-23 DIAGNOSIS — R197 Diarrhea, unspecified: Secondary | ICD-10-CM | POA: Diagnosis not present

## 2021-10-01 DIAGNOSIS — K8020 Calculus of gallbladder without cholecystitis without obstruction: Secondary | ICD-10-CM

## 2021-10-01 HISTORY — DX: Calculus of gallbladder without cholecystitis without obstruction: K80.20

## 2021-10-07 DIAGNOSIS — R109 Unspecified abdominal pain: Secondary | ICD-10-CM | POA: Diagnosis not present

## 2021-10-07 DIAGNOSIS — Z23 Encounter for immunization: Secondary | ICD-10-CM | POA: Diagnosis not present

## 2021-10-07 DIAGNOSIS — R5381 Other malaise: Secondary | ICD-10-CM | POA: Diagnosis not present

## 2021-10-12 ENCOUNTER — Other Ambulatory Visit: Payer: Self-pay

## 2021-10-12 ENCOUNTER — Encounter (HOSPITAL_BASED_OUTPATIENT_CLINIC_OR_DEPARTMENT_OTHER): Payer: Self-pay | Admitting: Obstetrics and Gynecology

## 2021-10-12 ENCOUNTER — Emergency Department (HOSPITAL_BASED_OUTPATIENT_CLINIC_OR_DEPARTMENT_OTHER)
Admission: EM | Admit: 2021-10-12 | Discharge: 2021-10-12 | Disposition: A | Discharge status: Home / Self Care | Payer: Medicare Other | Attending: Emergency Medicine | Admitting: Emergency Medicine

## 2021-10-12 ENCOUNTER — Emergency Department (HOSPITAL_BASED_OUTPATIENT_CLINIC_OR_DEPARTMENT_OTHER): Payer: Medicare Other

## 2021-10-12 DIAGNOSIS — R109 Unspecified abdominal pain: Secondary | ICD-10-CM | POA: Diagnosis not present

## 2021-10-12 DIAGNOSIS — Z79899 Other long term (current) drug therapy: Secondary | ICD-10-CM | POA: Diagnosis not present

## 2021-10-12 DIAGNOSIS — K449 Diaphragmatic hernia without obstruction or gangrene: Secondary | ICD-10-CM | POA: Diagnosis not present

## 2021-10-12 DIAGNOSIS — R1013 Epigastric pain: Secondary | ICD-10-CM | POA: Diagnosis not present

## 2021-10-12 DIAGNOSIS — R1011 Right upper quadrant pain: Secondary | ICD-10-CM | POA: Diagnosis present

## 2021-10-12 DIAGNOSIS — I700 Atherosclerosis of aorta: Secondary | ICD-10-CM | POA: Diagnosis not present

## 2021-10-12 LAB — COMPREHENSIVE METABOLIC PANEL
ALT: 12 U/L (ref 0–44)
AST: 18 U/L (ref 15–41)
Albumin: 4.6 g/dL (ref 3.5–5.0)
Alkaline Phosphatase: 49 U/L (ref 38–126)
Anion gap: 12 (ref 5–15)
BUN: 28 mg/dL — ABNORMAL HIGH (ref 8–23)
CO2: 26 mmol/L (ref 22–32)
Calcium: 10.8 mg/dL — ABNORMAL HIGH (ref 8.9–10.3)
Chloride: 101 mmol/L (ref 98–111)
Creatinine, Ser: 1.05 mg/dL — ABNORMAL HIGH (ref 0.44–1.00)
GFR, Estimated: 52 mL/min — ABNORMAL LOW (ref >60)
Glucose, Bld: 109 mg/dL — ABNORMAL HIGH (ref 70–99)
Potassium: 4.6 mmol/L (ref 3.5–5.1)
Sodium: 139 mmol/L (ref 135–145)
Total Bilirubin: 0.5 mg/dL (ref 0.3–1.2)
Total Protein: 6.8 g/dL (ref 6.5–8.1)

## 2021-10-12 LAB — CBC
HCT: 44 % (ref 36.0–46.0)
Hemoglobin: 14.6 g/dL (ref 12.0–15.0)
MCH: 29.9 pg (ref 26.0–34.0)
MCHC: 33.2 g/dL (ref 30.0–36.0)
MCV: 90.2 fL (ref 80.0–100.0)
Platelets: 327 10*3/uL (ref 150–400)
RBC: 4.88 MIL/uL (ref 3.87–5.11)
RDW: 12.9 % (ref 11.5–15.5)
WBC: 8.2 10*3/uL (ref 4.0–10.5)
nRBC: 0 % (ref 0.0–0.2)

## 2021-10-12 LAB — LIPASE, BLOOD: Lipase: 30 U/L (ref 11–51)

## 2021-10-12 MED ORDER — ONDANSETRON HCL 4 MG/2ML IJ SOLN
4.0000 mg | Freq: Once | INTRAMUSCULAR | Status: DC
  Filled 2021-10-12: qty 2

## 2021-10-12 MED ORDER — FENTANYL CITRATE PF 50 MCG/ML IJ SOSY
50.0000 ug | PREFILLED_SYRINGE | Freq: Once | INTRAMUSCULAR | Status: DC
  Filled 2021-10-12: qty 1

## 2021-10-12 MED ORDER — FENTANYL CITRATE PF 50 MCG/ML IJ SOSY: 50.0000 ug | PREFILLED_SYRINGE | Freq: Once | INTRAMUSCULAR | Status: DC | PRN

## 2021-10-12 MED ORDER — ONDANSETRON HCL 4 MG/2ML IJ SOLN: 4.0000 mg | Freq: Once | INTRAMUSCULAR | Status: DC | PRN

## 2021-10-12 MED ORDER — IOHEXOL 300 MG/ML  SOLN
100.0000 mL | Freq: Once | INTRAMUSCULAR | Status: AC | PRN
  Administered 2021-10-12: 85 mL via INTRAVENOUS

## 2021-10-12 NOTE — ED Provider Notes (Signed)
[facility] EMERGENCY DEPT Provider Note   CSN: [id number] Arrival date & time: 10/12/21  1232     History  Chief Complaint  Patient presents with   Abdominal Pain    Valerie Fox is a 87 y.o. female who presents emergency department chief complaint of abdominal pain.  Patient has had 3 episodes of abdominal pain.  She was seen by her primary care physician who sent her for right upper quadrant ultrasound where she was noted to have a large mobile gallstone.  Patient states that she ate breakfast around 7:30 AM.  Around 11:30 AM she had onset of epigastric pain which she describes as severe.  She was in tears.  It was then progressive in nature and encompassing her entire abdomen.  She is currently pain-free.  She did not have any vomiting or nausea.  She called the surgical office earlier today to see about her referral appointment however according to the office if they were still "processing my appointment." She denies any chest pain, shortness of breath or vomiting.  She states that the pain across her entire abdomen was unusual for her gallstone attacks.   Abdominal Pain      Home Medications Prior to Admission medications   Medication Sig Start Date End Date Taking? Authorizing Provider  AMBULATORY NON FORMULARY MEDICATION Medication Name: fiber capsule-2 capsule daily    [provider]  B Complex Vitamins (VITAMIN B-COMPLEX) TABS Take by mouth.    [provider]  BIOTIN PO Take 5,000 mcg by mouth daily.     [provider]  citalopram (CELEXA) 20 MG tablet Take 20 mg by mouth daily.      [provider]  Cranberry 400 MG CAPS Take 1 capsule by mouth 2 (two) times daily.    [provider]  Cyanocobalamin (VITAMIN B-12) 2500 MCG SUBL Place 2 tablets under the tongue daily.    [provider]  fluticasone (FLONASE) 50 MCG/ACT nasal spray Place into the nose. 09/17/13   [provider]  Glucosamine  HCl (GLUCOSAMINE PO) Take 1 capsule by mouth daily.    [provider]  Glucosamine HCl 1500 MG TABS Take by mouth.    [provider]  lisinopril-hydrochlorothiazide (PRINZIDE,ZESTORETIC) 20-12.5 MG per tablet Take 1 tablet by mouth daily.      [provider]  Multiple Vitamin (MULTIVITAMIN) tablet Take by mouth.    [provider]  Multiple Vitamins-Minerals (ICAPS AREDS 2 PO) Take 1 capsule by mouth 2 (two) times daily.    [provider]  nitrofurantoin, macrocrystal-monohydrate, (MACROBID) 100 MG capsule TAKE ONE CAPSULE (100 MG DOSE) BY MOUTH 2 (TWO) TIMES DAILY FOR 3 DAYS. 08/14/18   [provider]  NON FORMULARY Antifungal nail solution from [facility], faxed 08/28/2018 HS-[facility]MA    [provider]  Omega-3 1000 MG CAPS Take by mouth.    [provider]  Omega-3 Krill Oil 500 MG CAPS Take 1 capsule by mouth daily.      [provider]  Probiotic Product (PROBIOTIC PO) Take 1 capsule by mouth as needed.    [provider]  Turmeric 500 MG CAPS Take 1,000 mg by mouth daily.    [provider]      Allergies    Aspirin, Celebrex [celecoxib], Bactrim [sulfamethoxazole-trimethoprim], Ciprofloxacin hcl, Flagyl [metronidazole hcl], and Nsaids    Review of Systems   Review of Systems  Gastrointestinal:  Positive for abdominal pain.    Physical Exam Updated  Vital Signs BP 133/75   Pulse 70   Temp 97.6 F (36.4 C)   Resp 18   SpO2 100%  Physical Exam Vitals and nursing note reviewed.  Constitutional:      General: She is not in acute distress.    Appearance: She is well-developed. She is not diaphoretic.  HENT:     Head: Normocephalic and atraumatic.     Right Ear: External ear normal.     Left Ear: External ear normal.     Nose: Nose normal.     Mouth/Throat:     Mouth: Mucous membranes are moist.  Eyes:     General: No scleral icterus.    Conjunctiva/sclera:  Conjunctivae normal.  Cardiovascular:     Rate and Rhythm: Normal rate and regular rhythm.     Heart sounds: Normal heart sounds. No murmur heard.    No friction rub. No gallop.  Pulmonary:     Effort: Pulmonary effort is normal. No respiratory distress.     Breath sounds: Normal breath sounds.  Abdominal:     General: Bowel sounds are normal. There is no distension.     Palpations: Abdomen is soft. There is no mass.     Tenderness: There is abdominal tenderness in the right upper quadrant. There is no guarding.  Musculoskeletal:     Cervical back: Normal range of motion.  Skin:    General: Skin is warm and dry.  Neurological:     Mental Status: She is alert and oriented to person, place, and time.  Psychiatric:        Behavior: Behavior normal.     ED Results / Procedures / Treatments   Labs (all labs ordered are listed, but only abnormal results are displayed) Labs Reviewed  COMPREHENSIVE METABOLIC PANEL - Abnormal; Notable for the following components:      Result Value   Glucose, Bld 109 (*)    BUN 28 (*)    Creatinine, Ser 1.05 (*)    Calcium 10.8 (*)    GFR, Estimated 52 (*)    All other components within normal limits  LIPASE, BLOOD  CBC    EKG None  Radiology No results found.  Procedures Procedures    Medications Ordered in ED Medications  fentaNYL (SUBLIMAZE) injection 50 mcg (has no administration in time range)    And  ondansetron (ZOFRAN) injection 4 mg (has no administration in time range)  iohexol (OMNIPAQUE) 300 MG/ML solution 100 mL (85 mLs Intravenous Contrast Given 10/12/21 1510)    ED Course/ Medical Decision Making/ A&P                           Medical Decision Making 87 year old female who presents with acute onset epigastric abdominal pain.  Differential diagnosis includes peptic ulcer disease, gastritis, pancreatitis, biliary colic, cholecystitis, hiatal hernia, gastric volvulus.  She is currently pain-free.  I ordered and  reviewed labs.  There is no elevated AST ALT or alkaline phosphatase suggestive of biliary disease.  CBC within normal limits without elevated white blood cell count or anemia.  I visualized and interpreted the CT abdomen pelvis which shows a mobile gallstone, no evidence of acute abnormality otherwise.  She does have a very large hiatal hernia.  I discussed the case with Richard Miu, PA-C who saw I have discussed all findings of the patient's work-up.  Discussed need for close follow-up and that she has secured a surgical appointment in the next  2 days for the patient. I also discussed the case with the PA on-call for [facility] gastroenterology as I have concerned that she could have had a potential for intermittent gastric volvulus given the different symptoms she experienced today.  She is not having any hematemesis or severe pain.  PA from [place] states that she will begin working on securing her close follow-up appointment when she gets into clinic tomorrow.  Patient is very comfortable with this plan.  She is not nauseous or having any other current symptoms.  She has normal vital signs here and appears otherwise appropriate for discharge  Amount and/or Complexity of Data Reviewed Labs: ordered. Radiology: ordered and independent interpretation performed.  Risk Prescription drug management.           Final Clinical Impression(s) / ED Diagnoses Final diagnoses:  None    Rx / DC Orders ED Discharge Orders     None         Margarita Mail, PA-C 10/12/21 1749    Fransico Meadow, MD 10/13/21 1124

## 2021-10-12 NOTE — ED Notes (Signed)
Jennifer lemmons paged wth [facility] per verbal-  AOM 16:44 [phone]

## 2021-10-12 NOTE — ED Notes (Signed)
Patient transported to CT 

## 2021-10-12 NOTE — Discharge Instructions (Signed)

## 2021-10-12 NOTE — ED Triage Notes (Signed)
Patient reports she has a known gallstone and is supposed to be getting an apt for surgery but states she called today and the referral had not yet been processed. Patient reports she is having severe abdominal pain that feels like her recent gallbladder attacks.

## 2021-10-13 ENCOUNTER — Telehealth: Payer: Self-pay

## 2021-10-13 NOTE — Telephone Encounter (Signed)
Pt scheduled to see Nicoletta Ba PA 11/04/21 at 1:30pm. Appt letter mailed to pt.

## 2021-10-13 NOTE — Telephone Encounter (Signed)
-----   Message from Levin Erp, [place] sent at 10/13/2021  8:17 AM EDT ----- Regarding: appt Please call this patient and help her schedule appt with dr stark or any APP in the next 3-4 weeks to discuss hiatal hernia.  Thanks-JLL

## 2021-10-14 ENCOUNTER — Ambulatory Visit: Payer: Self-pay | Admitting: Surgery

## 2021-10-14 DIAGNOSIS — K8020 Calculus of gallbladder without cholecystitis without obstruction: Secondary | ICD-10-CM | POA: Diagnosis not present

## 2021-10-26 NOTE — Telephone Encounter (Signed)
Pt is requesting a call back to further discuss this appointment.

## 2021-10-27 NOTE — Telephone Encounter (Signed)
Pt was concerned about not seeing the doctor. Discussed with her that Dr. Fuller Plan will review the plan of care and make any changes in the plan if he thinks anything needs to be adjusted. Pt feels better knowing this and will keep appt as scheduled.

## 2021-10-27 NOTE — Telephone Encounter (Signed)
Left message for pt to call back  °

## 2021-11-03 NOTE — Patient Instructions (Addendum)
SURGICAL WAITING ROOM VISITATION Patients having surgery or a procedure may have no more than 2 support people in the waiting area - these visitors may rotate in the visitor waiting room.   Children under the age of 75 must have an adult with them who is not the patient. If the patient needs to stay at the hospital during part of their recovery, the visitor guidelines for inpatient rooms apply.  PRE-OP VISITATION  Pre-op nurse will coordinate an appropriate time for 1 support person to accompany the patient in pre-op.  This support person may not rotate.  This visitor will be contacted when the time is appropriate for the visitor to come back in the pre-op area.  Please refer to the [facility] website for the visitor guidelines for Inpatients (after your surgery is over and you are in a regular room).  You are not required to quarantine at this time prior to your surgery. However, you must do this: Hand Hygiene often Do NOT share personal items Notify your provider if you are in close contact with someone who has COVID or you develop fever 100.4 or greater, new onset of sneezing, cough, sore throat, shortness of breath or body aches.   If you received a COVID test during your pre-op visit  it is requested that you wear a mask when out in public, stay away from anyone that may not be feeling well and notify your surgeon if you develop symptoms. If you test positive for Covid or have been in contact with anyone that has tested positive in the last 10 days please notify you surgeon.       Your procedure is scheduled on:  Monday November 15, 2021  Report to [facility] Main Entrance.  Report to admitting at:  07:45   AM  +++++Call this number if you have any questions or problems the morning of surgery [phone]  Do not eat food :After Midnight the night prior to your surgery/procedure.  After Midnight you may have the following liquids until   07:00  AM  DAY OF SURGERY  Clear  Liquid Diet Water Black Coffee (sugar ok, NO MILK/CREAM OR CREAMERS)  Tea (sugar ok, NO MILK/CREAM OR CREAMERS) regular and decaf                             Plain Jell-O  with no fruit (NO RED)                                           Fruit ices (not with fruit pulp, NO RED)                                     Popsicles (NO RED)                                                                  Juice: apple, WHITE grape, WHITE cranberry Sports drinks like Gatorade or Powerade (NO RED)  FOLLOW BOWEL PREP AND ANY ADDITIONAL PRE OP INSTRUCTIONS YOU RECEIVED FROM YOUR SURGEON'S OFFICE!!!   Oral Hygiene is also important to reduce your risk of infection.        Remember - BRUSH YOUR TEETH THE MORNING OF SURGERY WITH YOUR REGULAR TOOTHPASTE  Do NOT smoke after Midnight the night before surgery.  Take ONLY these medicines the morning of surgery with A SIP OF WATER: Citalopram (Celexa)   You may not have any metal on your body including hair pins, jewelry, and body piercing  Do not wear make-up, lotions, powders, perfumes  or deodorant  Do not wear nail polish including gel and S&S, artificial / acrylic nails, or any other type of covering on natural nails including finger and toenails. If you have artificial nails, gel coating, etc., that needs to be removed by a nail salon, Please have this removed prior to surgery. Not doing so may mean that your surgery could be cancelled or delayed if the Surgeon or anesthesia staff feels like they are unable to monitor you safely.   Do not shave 48 hours prior to surgery to avoid nicks in your skin which may contribute to postoperative infections.   Contacts, Hearing Aids, dentures or bridgework may not be worn into surgery.    DO NOT [place]. PHARMACY WILL DISPENSE MEDICATIONS LISTED ON YOUR MEDICATION LIST TO YOU DURING YOUR ADMISSION [place]!   Patients discharged on the day of surgery  will not be allowed to drive home.  Someone NEEDS to stay with you for the first 24 hours after anesthesia.  Special Instructions: Bring a copy of your healthcare power of attorney and living will documents the day of surgery, if you wish to have them scanned into your [facility] Records- EPIC  Please read over the following fact sheets you were given: IF YOU HAVE QUESTIONS ABOUT YOUR PRE-OP INSTRUCTIONS, PLEASE CALL [phone]  ([place])   [facility] - Preparing for Surgery Before surgery, you can play an important role.  Because skin is not sterile, your skin needs to be as free of germs as possible.  You can reduce the number of germs on your skin by washing with CHG (chlorahexidine gluconate) soap before surgery.  CHG is an antiseptic cleaner which kills germs and bonds with the skin to continue killing germs even after washing. Please DO NOT use if you have an allergy to CHG or antibacterial soaps.  If your skin becomes reddened/irritated stop using the CHG and inform your nurse when you arrive at Short Stay. Do not shave (including legs and underarms) for at least 48 hours prior to the first CHG shower.  You may shave your face/neck.  Please follow these instructions carefully:  1.  Shower with CHG Soap the night before surgery and the  morning of surgery.  2.  If you choose to wash your hair, wash your hair first as usual with your normal  shampoo.  3.  After you shampoo, rinse your hair and body thoroughly to remove the shampoo.                             4.  Use CHG as you would any other liquid soap.  You can apply chg directly to the skin and wash.  Gently with a scrungie or clean washcloth.  5.  Apply the CHG Soap to your body ONLY FROM THE NECK DOWN.  Do not use on face/ open                           Wound or open sores. Avoid contact with eyes, ears mouth and genitals (private parts).                       Wash face,  Genitals (private parts) with your normal soap.              6.  Wash thoroughly, paying special attention to the area where your  surgery  will be performed.  7.  Thoroughly rinse your body with warm water from the neck down.  8.  DO NOT shower/wash with your normal soap after using and rinsing off the CHG Soap.            9.  Pat yourself dry with a clean towel.            10.  Wear clean pajamas.            11.  Place clean sheets on your bed the night of your first shower and do not  sleep with pets.  ON THE DAY OF SURGERY : Do not apply any lotions/deodorants the morning of surgery.  Please wear clean clothes to the hospital/surgery center.    FAILURE TO FOLLOW THESE INSTRUCTIONS MAY RESULT IN THE CANCELLATION OF YOUR SURGERY  PATIENT SIGNATURE_________________________________  NURSE SIGNATURE__________________________________  ________________________________________________________________________

## 2021-11-03 NOTE — Progress Notes (Addendum)
COVID Vaccine received:  '[]'$  No '[x]'$  Yes Date of any COVID positive Test in last 90 days:  None  PCP - Tamsen Meek, MD Cardiologist - none GI - Dr. Fuller Plan,   Nicoletta Ba, PA  Chest x-ray - n/a EKG -  10-26-2018  Epic    Will repeat at PST Stress Test - n/a ECHO - 03-17-2017  Epic Cardiac Cath - n/a  Pacemaker/ICD device     '[x]'$  N/A Spinal Cord Stimulator:'[x]'$  No '[]'$  Yes      (Remind patient to bring remote DOS) Other Implants:   Bowel Prep -none except avoid gassy foods and eat a bland diet prior to surgery.   History of Sleep Apnea? '[x]'$  No '[]'$  Yes   Sleep Study Date:   CPAP used?- '[x]'$  No '[]'$  Yes  (Instruct to bring their mask & Tubing)  Does the patient monitor blood sugar? '[]'$  No '[]'$  Yes  '[x]'$  N/A  Blood Thinner Instructions:  none Aspirin Instructions: none Last Dose:  ERAS Protocol Ordered: '[]'$  No  '[x]'$  Yes  no drink order  Activity level: Patient can climb a flight of stairs without difficulty; '[x]'$  No CP   but would have _SOB__   Anesthesia review: CKD3, Liver cysts, HTN, PVCs  Patient denies shortness of breath, fever, cough and chest pain at PAT appointment.  Patient verbalized understanding and agreement to the Pre-Surgical Instructions that were given to them at this PAT appointment. Patient was also educated of the need to review these PAT instructions again prior to his/her surgery.I reviewed the appropriate phone numbers to call if they have any and questions or concerns.

## 2021-11-04 ENCOUNTER — Ambulatory Visit: Payer: Medicare Other | Admitting: Physician Assistant

## 2021-11-04 ENCOUNTER — Encounter: Payer: Self-pay | Admitting: Physician Assistant

## 2021-11-04 VITALS — BP 116/68 | HR 83 | Ht 63.0 in | Wt 161.6 lb

## 2021-11-04 DIAGNOSIS — K8050 Calculus of bile duct without cholangitis or cholecystitis without obstruction: Secondary | ICD-10-CM | POA: Diagnosis not present

## 2021-11-04 DIAGNOSIS — K8020 Calculus of gallbladder without cholecystitis without obstruction: Secondary | ICD-10-CM | POA: Diagnosis not present

## 2021-11-04 DIAGNOSIS — K449 Diaphragmatic hernia without obstruction or gangrene: Secondary | ICD-10-CM | POA: Diagnosis not present

## 2021-11-04 NOTE — Progress Notes (Signed)
Subjective:    Patient ID: Valerie Fox, female    DOB: 01-Jun-1934, 87 y.o.   MRN: [id number]  HPI Valerie Fox is a pleasant 87 year old white female, established with Dr. Fuller Plan and last seen in the office in 2019.  She comes in today after recent ER visit on 10/12/2021 with acute epigastric pain, nausea and vomiting.  She has had 3 discrete episodes over the past couple of months usually lasting 10 to 15 minutes but intense while present.  After the episodes subside she has had some mild residual epigastric discomfort. Work-up with a ER visit with CT of the abdomen and pelvis showed a left lobe hepatic cyst measuring 6.1 x 7.4 cm, a large hiatal hernia (no specific measurement given, and not commented that stomach was intrathoracic) noted to have sigmoid diverticulosis and unremarkable gallbladder. Ultrasound showed a 2.1 cm mobile gallstone without gallbladder wall thickening and CBD of 2.4 mm. CBC was normal, c-Met unremarkable with normal LFTs and normal lipase.  She has not had any recurrent episodes since the ER visit  She has since seen Dr. Renne Crigler and is scheduled for a laparoscopic cholecystectomy on 11/15/2021 She was advised at the time of the ER visit to follow-up with gastroenterology because of the finding of the large hiatal hernia noted on CT.  Patient does have history of GERD and says that in the past year she had been on treatment for acid reflux but found that she would have rebound symptoms if she misses doses and that this was actually worse than the initial reflux symptoms so she gradually weaned herself off of medications and has not required anything over the past several years.  She says if she eats something spicy she will usually take a Tums. She has no complaints of any ongoing heartburn or indigestion.  Occasionally may have some mild dysphagia with pills but has not noted any dysphagia with solids or liquids.  She has not had any chest discomfort. Last EGD here was  done in 2007 by Dr. Lyla Son with finding of very mild distal esophagitis, no hiatal hernia was commented on. Colonoscopy May 2019 with a 7 mm polyp in the transverse colon multiple diverticuli and internal hemorrhoids.  Path showed benign colonic mucosa with lymphoid aggregate.  No follow-up planned due to age   Review of Systems Pertinent positive and negative review of systems were noted in the above HPI section.  All other review of systems was otherwise negative.   Outpatient Encounter Medications as of 11/04/2021  Medication Sig   acetaminophen (TYLENOL) 325 MG tablet Take 650 mg by mouth every 6 (six) hours as needed for moderate pain.   Cholecalciferol (VITAMIN D3) 75 MCG (3000 UT) TABS Take 6,000 Units by mouth daily.   citalopram (CELEXA) 20 MG tablet Take 20-30 mg by mouth daily.   COLLAGEN PO Take 10 mLs by mouth daily.   lisinopril-hydrochlorothiazide (PRINZIDE,ZESTORETIC) 20-12.5 MG per tablet Take 1 tablet by mouth daily.     Multiple Vitamins-Minerals (PRESERVISION AREDS 2 PO) Take 1 capsule by mouth daily.   polyvinyl alcohol (LIQUIFILM TEARS) 1.4 % ophthalmic solution Place 1 drop into both eyes as needed for dry eyes.   Probiotic Product (PROBIOTIC PO) Take 1 capsule by mouth daily.   psyllium (REGULOID) 0.52 g capsule Take 0.52 g by mouth daily.   tretinoin (RETIN-A) 0.025 % cream Apply 1 application  topically at bedtime.   Turmeric 500 MG CAPS Take 1,000 mg by mouth daily.   zinc  gluconate 50 MG tablet Take 50 mg by mouth daily.   Facility-Administered Encounter Medications as of 11/04/2021  Medication   0.9 %  sodium chloride infusion   Allergies  Allergen Reactions   Aspirin Anaphylaxis   Celebrex [Celecoxib] Anaphylaxis   Nsaids Anaphylaxis    Because of reaction to celebrex.   Bactrim [Sulfamethoxazole-Trimethoprim] Hives   Ciprofloxacin Hcl Hives   Flagyl [Metronidazole Hcl] Hives   Patient Active Problem List   Diagnosis Date Noted   Esophageal reflux  02/16/2011   Personal history of colonic polyps 02/16/2011   Social History   Socioeconomic History   Marital status: Married    Spouse name: Not on file   Number of children: Not on file   Years of education: Not on file   Highest education level: Not on file  Occupational History   Not on file  Tobacco Use   Smoking status: Never    Passive exposure: Never   Smokeless tobacco: Never  Vaping Use   Vaping Use: Never used  Substance and Sexual Activity   Alcohol use: Yes    Comment: occasional   Drug use: No   Sexual activity: Yes  Other Topics Concern   Not on file  Social History Narrative   Not on file   Social Determinants of Health   Financial Resource Strain: Not on file  Food Insecurity: Not on file  Transportation Needs: Not on file  Physical Activity: Not on file  Stress: Not on file  Social Connections: Not on file  Intimate Partner Violence: Not on file    Valerie Fox family history includes Breast cancer in her sister; Diabetes in an other family member; Heart disease in her father and mother.      Objective:    Vitals:   11/04/21 1327  BP: 116/68  Pulse: 83  SpO2: 98%    Physical Exam Well-developed well-nourished elderly white female in no acute distress.  Height, Weight, 161 BMI 28.6  HEENT; nontraumatic normocephalic, EOMI, PE R LA, sclera anicteric. Oropharynx; examined today Neck; supple, no JVD Cardiovascular; regular rate and rhythm with S1-S2, no murmur rub or gallop Pulmonary; Clear bilaterally Abdomen; soft, nontender, nondistended, no palpable mass or hepatosplenomegaly, bowel sounds are active Rectal; not done today Skin; benign exam, no jaundice rash or appreciable lesions Extremities; no clubbing cyanosis or edema skin warm and dry Neuro/Psych; alert and oriented x4, grossly nonfocal mood and affect appropriate        Assessment & Plan:   #14 87 year old white female with 3 recent episodes of epigastric pain associated  with nausea and vomiting, no associated chest pain or radiation to the back ER evaluation 10/12/2021 with CT and then abdominal ultrasound-revealed a large mobile gallstone, no gallbladder wall thickening and no ductal dilation. On CT was noted to have a large hiatal hernia, and a left lobe liver cyst 6.1 x 7.4 cm  She has been seen by surgery and is scheduled for laparoscopic cholecystectomy on 11/15/2021 and felt to have symptomatic cholelithiasis/biliary colic.  I do not think her current symptoms are suggestive of a paraesophageal hernia or volvulus.   #2 history of adenomatous colon polyps-last colonoscopy 2019, 1 benign polyp.  No follow-up planned due to age  Plan; we discussed the hiatal hernia, which at this time does not appear to be symptomatic. We discussed barium swallow to further delineate the hiatal hernia regarding size and any paraesophageal component etc.  She is not inclined to do any further work-up for  the hiatal hernia at present. She will follow through with the cholecystectomy later this month, then monitor for any future symptoms of lower chest discomfort, nausea vomiting or dysphagia, and knows to contact us with any of those symptoms and at that time could proceed with barium swallow and/or EGD.  Lachae Hohler Genia Harold PA-C 11/04/2021   Cc: Lawerance Cruel, MD

## 2021-11-04 NOTE — Patient Instructions (Addendum)
If you are age 85 or younger, your body mass index should be between 19-25. Your Body mass index is 28.63 kg/m. If this is out of the aformentioned range listed, please consider follow up with your Primary Care Provider.   __________________________________________________________  The [facility] providers would like to encourage you to use [employer] to communicate with providers for non-urgent requests or questions.  Due to long hold times on the telephone, sending your provider a message by [facility] may be a faster and more efficient way to get a response.  Please allow 48 business hours for a response.  Please remember that this is for non-urgent requests.   Follow up as needed with Dr. Fuller Plan  Thank you for choosing me and [facility].  Edward Jolly, PA-C

## 2021-11-05 ENCOUNTER — Other Ambulatory Visit: Payer: Self-pay

## 2021-11-05 ENCOUNTER — Encounter (HOSPITAL_COMMUNITY)
Admission: RE | Admit: 2021-11-05 | Discharge: 2021-11-05 | Disposition: A | Discharge status: Home / Self Care | Payer: Medicare Other | Source: Ambulatory Visit | Attending: Surgery | Admitting: Surgery

## 2021-11-05 ENCOUNTER — Encounter (HOSPITAL_COMMUNITY): Payer: Self-pay

## 2021-11-05 VITALS — BP 125/78 | HR 78 | Temp 98.3°F | Resp 16 | Ht 63.0 in | Wt 158.0 lb

## 2021-11-05 DIAGNOSIS — I10 Essential (primary) hypertension: Secondary | ICD-10-CM | POA: Diagnosis not present

## 2021-11-05 DIAGNOSIS — Z01818 Encounter for other preprocedural examination: Secondary | ICD-10-CM | POA: Insufficient documentation

## 2021-11-05 DIAGNOSIS — K769 Liver disease, unspecified: Secondary | ICD-10-CM | POA: Diagnosis not present

## 2021-11-05 LAB — CBC
HCT: 41.9 % (ref 36.0–46.0)
Hemoglobin: 13.7 g/dL (ref 12.0–15.0)
MCH: 29.8 pg (ref 26.0–34.0)
MCHC: 32.7 g/dL (ref 30.0–36.0)
MCV: 91.3 fL (ref 80.0–100.0)
Platelets: 283 10*3/uL (ref 150–400)
RBC: 4.59 MIL/uL (ref 3.87–5.11)
RDW: 12.7 % (ref 11.5–15.5)
WBC: 5.7 10*3/uL (ref 4.0–10.5)
nRBC: 0 % (ref 0.0–0.2)

## 2021-11-05 LAB — COMPREHENSIVE METABOLIC PANEL
ALT: 14 U/L (ref 0–44)
AST: 21 U/L (ref 15–41)
Albumin: 4 g/dL (ref 3.5–5.0)
Alkaline Phosphatase: 47 U/L (ref 38–126)
Anion gap: 6 (ref 5–15)
BUN: 20 mg/dL (ref 8–23)
CO2: 30 mmol/L (ref 22–32)
Calcium: 9.9 mg/dL (ref 8.9–10.3)
Chloride: 105 mmol/L (ref 98–111)
Creatinine, Ser: 1.16 mg/dL — ABNORMAL HIGH (ref 0.44–1.00)
GFR, Estimated: 46 mL/min — ABNORMAL LOW (ref >60)
Glucose, Bld: 86 mg/dL (ref 70–99)
Potassium: 4.3 mmol/L (ref 3.5–5.1)
Sodium: 141 mmol/L (ref 135–145)
Total Bilirubin: 0.6 mg/dL (ref 0.3–1.2)
Total Protein: 6.5 g/dL (ref 6.5–8.1)

## 2021-11-08 ENCOUNTER — Encounter (HOSPITAL_COMMUNITY): Payer: Self-pay | Admitting: Surgery

## 2021-11-08 DIAGNOSIS — K8020 Calculus of gallbladder without cholecystitis without obstruction: Secondary | ICD-10-CM | POA: Diagnosis present

## 2021-11-08 NOTE — H&P (View-Only) (Signed)
PROVIDER: Talmage Teaster Charlotta Newton, MD   Chief Complaint: New Consultation (Symptomatic cholelithiasis)  History of Present Illness:  Patient is referred by Dr. Lawerance Cruel for surgical evaluation and management of symptomatic cholelithiasis. Patient had her initial symptoms in July 2023. She had a second episode of biliary colic in August. Evaluation included an ultrasound which was performed on September 23, 2021. This demonstrated a 2.1 cm mobile gallstone. There were no acute inflammatory changes. Also noted was a large left hepatic cyst measuring 11 cm in diameter. This has been present since a previous study in 2007. 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 Patient was seen in the emergency department in early September 2023. This appears to be another episode of biliary colic. A CT scan of the abdomen was performed at that time. It showed no acute findings. Patient did have evidence of sigmoid diverticulosis. She also has a large hiatal hernia. Previous abdominal surgery includes a vaginal hysterectomy and later oophorectomy. Patient has had 3 prior breast surgeries. There is no family history of gallbladder disease. Patient denies any history of jaundice or acholic stools. She has had episodes of diarrhea associated with biliary colic. She denies any history of hepatitis or pancreatitis. Patient presents today to discuss proceeding with cholecystectomy.  Review of Systems: A complete review of systems was obtained from the patient. I have reviewed this information and discussed as appropriate with the patient. See HPI as well for other ROS.  Review of Systems  Constitutional: Negative.  HENT: Negative.  Eyes: Negative.  Respiratory: Negative.  Cardiovascular: Negative.  Gastrointestinal: Positive for abdominal pain, diarrhea and nausea.  Genitourinary: Negative.  Musculoskeletal: Negative.  Skin: Negative.  Neurological: Negative.  Endo/Heme/Allergies:  Negative.  Psychiatric/Behavioral: Negative.   Medical History: History reviewed. No pertinent past medical history.  Patient Active Problem List  Diagnosis  Symptomatic cholelithiasis   History reviewed. No pertinent surgical history.   Allergies  Allergen Reactions  Aspirin Anaphylaxis  Nsaids, ibuprofen  Celecoxib Anaphylaxis  Was told because of this severe reaction to also avoid ASA and Advil  Nsaids (Non-Steroidal Anti-Inflammatory Drug) Anaphylaxis  Because of reaction to celebrex.  Does not take due to reaction from Celebrex  Ciprofloxacin Hives and Rash  Metronidazole Hives and Rash  Sulfamethoxazole-Trimethoprim Hives   No current outpatient medications on file prior to visit.   No current facility-administered medications on file prior to visit.   History reviewed. No pertinent family history.   Social History   Tobacco Use  Smoking Status Not on file  Smokeless Tobacco Not on file    Social History   Socioeconomic History  Marital status: Married   Objective:   Vitals:  BP: 120/74  Pulse: 103  Temp: 37 C (98.6 F)  SpO2: 98%  Weight: 72.3 kg (159 lb 6.4 oz)  Height: 160 cm ('5\' 3"'$ )   Body mass index is 28.24 kg/m.  Physical Exam   GENERAL APPEARANCE Comfortable, no acute issues Development: normal Gross deformities: none  SKIN Rash, lesions, ulcers: none Induration, erythema: none Nodules: none palpable  EYES Conjunctiva and lids: normal Pupils: equal and reactive  EARS, NOSE, MOUTH, THROAT External ears: no lesion or deformity External nose: no lesion or deformity Hearing: grossly normal  NECK Symmetric: yes Trachea: midline Thyroid: no palpable nodules in the thyroid bed  CHEST Respiratory effort: normal Retraction or accessory muscle use: no Breath sounds: normal bilaterally Rales, rhonchi, wheeze: none  CARDIOVASCULAR Auscultation: regular rhythm, normal rate  Murmurs: none Pulses: radial pulse 2+  palpable Lower extremity edema: none  ABDOMEN Soft without distention. Well-healed lower midline surgical incision without evidence of herniation. Palpation in the upper abdomen shows mild tenderness in the right upper quadrant without guarding. There is no palpable mass.  GENITOURINARY/RECTAL Not assessed  MUSCULOSKELETAL Station and gait: normal Digits and nails: no clubbing or cyanosis Muscle strength: grossly normal all extremities Range of motion: grossly normal all extremities Deformity: none  LYMPHATIC Cervical: none palpable Supraclavicular: none palpable  PSYCHIATRIC Oriented to person, place, and time: yes Mood and affect: normal for situation Judgment and insight: appropriate for situation   Assessment and Plan:   Symptomatic cholelithiasis  Patient is referred by her primary care physician for surgical evaluation and management of symptomatic cholelithiasis. Patient has had intermittent episodes of biliary colic since July.  Patient is provided with written information on laparoscopic gallbladder surgery. She will review this at home. Today we discussed proceeding with gallbladder surgery. We discussed the anatomy. We discussed the laparoscopic technique. We discussed performing intraoperative cholangiography. We discussed the hospital stay to be anticipated. We discussed her postoperative recovery and return to activities. The patient understands and wants to proceed with surgery in the near future.  Armandina Gemma, MD [facility] [employer] practice Office: [phone]

## 2021-11-08 NOTE — H&P (Signed)
PROVIDER: Akeel Reffner Charlotta Newton, MD   Chief Complaint: New Consultation (Symptomatic cholelithiasis)  History of Present Illness:  Patient is referred by Dr. Lawerance Cruel for surgical evaluation and management of symptomatic cholelithiasis. Patient had her initial symptoms in July 2023. She had a second episode of biliary colic in August. Evaluation included an ultrasound which was performed on September 23, 2021. This demonstrated a 2.1 cm mobile gallstone. There were no acute inflammatory changes. Also noted was a large left hepatic cyst measuring 11 cm in diameter. This has been present since a previous study in 2007. 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 Patient was seen in the emergency department in early September 2023. This appears to be another episode of biliary colic. A CT scan of the abdomen was performed at that time. It showed no acute findings. Patient did have evidence of sigmoid diverticulosis. She also has a large hiatal hernia. Previous abdominal surgery includes a vaginal hysterectomy and later oophorectomy. Patient has had 3 prior breast surgeries. There is no family history of gallbladder disease. Patient denies any history of jaundice or acholic stools. She has had episodes of diarrhea associated with biliary colic. She denies any history of hepatitis or pancreatitis. Patient presents today to discuss proceeding with cholecystectomy.  Review of Systems: A complete review of systems was obtained from the patient. I have reviewed this information and discussed as appropriate with the patient. See HPI as well for other ROS.  Review of Systems  Constitutional: Negative.  HENT: Negative.  Eyes: Negative.  Respiratory: Negative.  Cardiovascular: Negative.  Gastrointestinal: Positive for abdominal pain, diarrhea and nausea.  Genitourinary: Negative.  Musculoskeletal: Negative.  Skin: Negative.  Neurological: Negative.  Endo/Heme/Allergies:  Negative.  Psychiatric/Behavioral: Negative.   Medical History: History reviewed. No pertinent past medical history.  Patient Active Problem List  Diagnosis  Symptomatic cholelithiasis   History reviewed. No pertinent surgical history.   Allergies  Allergen Reactions  Aspirin Anaphylaxis  Nsaids, ibuprofen  Celecoxib Anaphylaxis  Was told because of this severe reaction to also avoid ASA and Advil  Nsaids (Non-Steroidal Anti-Inflammatory Drug) Anaphylaxis  Because of reaction to celebrex.  Does not take due to reaction from Celebrex  Ciprofloxacin Hives and Rash  Metronidazole Hives and Rash  Sulfamethoxazole-Trimethoprim Hives   No current outpatient medications on file prior to visit.   No current facility-administered medications on file prior to visit.   History reviewed. No pertinent family history.   Social History   Tobacco Use  Smoking Status Not on file  Smokeless Tobacco Not on file    Social History   Socioeconomic History  Marital status: Married   Objective:   Vitals:  BP: 120/74  Pulse: 103  Temp: 37 C (98.6 F)  SpO2: 98%  Weight: 72.3 kg (159 lb 6.4 oz)  Height: 160 cm ('5\' 3"'$ )   Body mass index is 28.24 kg/m.  Physical Exam   GENERAL APPEARANCE Comfortable, no acute issues Development: normal Gross deformities: none  SKIN Rash, lesions, ulcers: none Induration, erythema: none Nodules: none palpable  EYES Conjunctiva and lids: normal Pupils: equal and reactive  EARS, NOSE, MOUTH, THROAT External ears: no lesion or deformity External nose: no lesion or deformity Hearing: grossly normal  NECK Symmetric: yes Trachea: midline Thyroid: no palpable nodules in the thyroid bed  CHEST Respiratory effort: normal Retraction or accessory muscle use: no Breath sounds: normal bilaterally Rales, rhonchi, wheeze: none  CARDIOVASCULAR Auscultation: regular rhythm, normal rate  Murmurs: none Pulses: radial pulse 2+  palpable Lower extremity edema: none  ABDOMEN Soft without distention. Well-healed lower midline surgical incision without evidence of herniation. Palpation in the upper abdomen shows mild tenderness in the right upper quadrant without guarding. There is no palpable mass.  GENITOURINARY/RECTAL Not assessed  MUSCULOSKELETAL Station and gait: normal Digits and nails: no clubbing or cyanosis Muscle strength: grossly normal all extremities Range of motion: grossly normal all extremities Deformity: none  LYMPHATIC Cervical: none palpable Supraclavicular: none palpable  PSYCHIATRIC Oriented to person, place, and time: yes Mood and affect: normal for situation Judgment and insight: appropriate for situation   Assessment and Plan:   Symptomatic cholelithiasis  Patient is referred by her primary care physician for surgical evaluation and management of symptomatic cholelithiasis. Patient has had intermittent episodes of biliary colic since July.  Patient is provided with written information on laparoscopic gallbladder surgery. She will review this at home. Today we discussed proceeding with gallbladder surgery. We discussed the anatomy. We discussed the laparoscopic technique. We discussed performing intraoperative cholangiography. We discussed the hospital stay to be anticipated. We discussed her postoperative recovery and return to activities. The patient understands and wants to proceed with surgery in the near future.  Armandina Gemma, MD [facility] [place] practice Office: [phone]

## 2021-11-13 ENCOUNTER — Encounter (HOSPITAL_BASED_OUTPATIENT_CLINIC_OR_DEPARTMENT_OTHER): Payer: Self-pay

## 2021-11-13 ENCOUNTER — Other Ambulatory Visit: Payer: Self-pay

## 2021-11-13 ENCOUNTER — Encounter (HOSPITAL_COMMUNITY): Payer: Self-pay | Admitting: Anesthesiology

## 2021-11-13 DIAGNOSIS — R7309 Other abnormal glucose: Secondary | ICD-10-CM | POA: Diagnosis not present

## 2021-11-13 DIAGNOSIS — N289 Disorder of kidney and ureter, unspecified: Secondary | ICD-10-CM | POA: Insufficient documentation

## 2021-11-13 DIAGNOSIS — K5732 Diverticulitis of large intestine without perforation or abscess without bleeding: Secondary | ICD-10-CM | POA: Insufficient documentation

## 2021-11-13 DIAGNOSIS — N2889 Other specified disorders of kidney and ureter: Secondary | ICD-10-CM | POA: Diagnosis not present

## 2021-11-13 DIAGNOSIS — Z79899 Other long term (current) drug therapy: Secondary | ICD-10-CM | POA: Diagnosis not present

## 2021-11-13 DIAGNOSIS — R1032 Left lower quadrant pain: Secondary | ICD-10-CM | POA: Diagnosis present

## 2021-11-13 DIAGNOSIS — K5792 Diverticulitis of intestine, part unspecified, without perforation or abscess without bleeding: Secondary | ICD-10-CM | POA: Diagnosis not present

## 2021-11-13 LAB — CBC
HCT: 40 % (ref 36.0–46.0)
Hemoglobin: 13.3 g/dL (ref 12.0–15.0)
MCH: 29.6 pg (ref 26.0–34.0)
MCHC: 33.3 g/dL (ref 30.0–36.0)
MCV: 89.1 fL (ref 80.0–100.0)
Platelets: 297 10*3/uL (ref 150–400)
RBC: 4.49 MIL/uL (ref 3.87–5.11)
RDW: 12.5 % (ref 11.5–15.5)
WBC: 9.8 10*3/uL (ref 4.0–10.5)
nRBC: 0 % (ref 0.0–0.2)

## 2021-11-13 LAB — COMPREHENSIVE METABOLIC PANEL
ALT: 10 U/L (ref 0–44)
AST: 18 U/L (ref 15–41)
Albumin: 4.4 g/dL (ref 3.5–5.0)
Alkaline Phosphatase: 41 U/L (ref 38–126)
Anion gap: 10 (ref 5–15)
BUN: 22 mg/dL (ref 8–23)
CO2: 29 mmol/L (ref 22–32)
Calcium: 10.4 mg/dL — ABNORMAL HIGH (ref 8.9–10.3)
Chloride: 100 mmol/L (ref 98–111)
Creatinine, Ser: 1.2 mg/dL — ABNORMAL HIGH (ref 0.44–1.00)
GFR, Estimated: 45 mL/min — ABNORMAL LOW (ref >60)
Glucose, Bld: 141 mg/dL — ABNORMAL HIGH (ref 70–99)
Potassium: 4.2 mmol/L (ref 3.5–5.1)
Sodium: 139 mmol/L (ref 135–145)
Total Bilirubin: 0.4 mg/dL (ref 0.3–1.2)
Total Protein: 6.9 g/dL (ref 6.5–8.1)

## 2021-11-13 LAB — LIPASE, BLOOD: Lipase: 35 U/L (ref 11–51)

## 2021-11-13 NOTE — ED Triage Notes (Signed)
Pt reports LLQ pain x3 hours. Pt is scheduled for "gallbladder surgery" on Monday but wanted to come get checked out.

## 2021-11-14 ENCOUNTER — Encounter (HOSPITAL_BASED_OUTPATIENT_CLINIC_OR_DEPARTMENT_OTHER): Payer: Self-pay | Admitting: Radiology

## 2021-11-14 ENCOUNTER — Emergency Department (HOSPITAL_BASED_OUTPATIENT_CLINIC_OR_DEPARTMENT_OTHER): Payer: Medicare Other

## 2021-11-14 ENCOUNTER — Emergency Department (HOSPITAL_BASED_OUTPATIENT_CLINIC_OR_DEPARTMENT_OTHER)
Admission: EM | Admit: 2021-11-14 | Discharge: 2021-11-14 | Disposition: A | Discharge status: Home / Self Care | Payer: Medicare Other | Attending: Emergency Medicine | Admitting: Emergency Medicine

## 2021-11-14 DIAGNOSIS — R7309 Other abnormal glucose: Secondary | ICD-10-CM

## 2021-11-14 DIAGNOSIS — R1032 Left lower quadrant pain: Secondary | ICD-10-CM | POA: Diagnosis not present

## 2021-11-14 DIAGNOSIS — N289 Disorder of kidney and ureter, unspecified: Secondary | ICD-10-CM

## 2021-11-14 DIAGNOSIS — K5732 Diverticulitis of large intestine without perforation or abscess without bleeding: Secondary | ICD-10-CM

## 2021-11-14 DIAGNOSIS — I700 Atherosclerosis of aorta: Secondary | ICD-10-CM | POA: Diagnosis not present

## 2021-11-14 LAB — URINALYSIS, ROUTINE W REFLEX MICROSCOPIC
Bilirubin Urine: NEGATIVE
Glucose, UA: NEGATIVE mg/dL
Hgb urine dipstick: NEGATIVE
Ketones, ur: NEGATIVE mg/dL
Leukocytes,Ua: NEGATIVE
Nitrite: NEGATIVE
Protein, ur: NEGATIVE mg/dL
Specific Gravity, Urine: 1.01 (ref 1.005–1.030)
pH: 6 (ref 5.0–8.0)

## 2021-11-14 MED ORDER — AMOXICILLIN-POT CLAVULANATE 875-125 MG PO TABS: 1.0000 | ORAL_TABLET | Freq: Two times a day (BID) | ORAL | 0 refills | Status: DC

## 2021-11-14 MED ORDER — IOHEXOL 300 MG/ML  SOLN
100.0000 mL | Freq: Once | INTRAMUSCULAR | Status: AC | PRN
  Administered 2021-11-14: 75 mL via INTRAVENOUS

## 2021-11-14 MED ORDER — AMOXICILLIN-POT CLAVULANATE 875-125 MG PO TABS
1.0000 | ORAL_TABLET | Freq: Once | ORAL | Status: AC
  Administered 2021-11-14: 1 via ORAL
  Filled 2021-11-14: qty 1

## 2021-11-14 NOTE — ED Provider Notes (Signed)
[facility] EMERGENCY DEPT Provider Note   CSN: [id number] Arrival date & time: 11/13/21  2032     History  Chief Complaint  Patient presents with   Abdominal Pain    Valerie Fox is a 87 y.o. female.  The history is provided by the patient.  Abdominal Pain She has history of hypertension, diverticulitis, symptomatic cholelithiasis and had onset this evening of left lower quadrant pain which is typical of her diverticulitis.  Pain did not radiate.  There was some mild nausea but no vomiting.  She denies fever, chills, sweats.  She denies any diarrhea.  She denies any urinary symptoms.  She is scheduled for an elective cholecystectomy on 10/16.  She talked with her surgeon who advised her to come in to be evaluated.   Home Medications Prior to Admission medications   Medication Sig Start Date End Date Taking? Authorizing Provider  acetaminophen (TYLENOL) 325 MG tablet Take 650 mg by mouth every 6 (six) hours as needed for moderate pain.    [provider]  Cholecalciferol (VITAMIN D3) 75 MCG (3000 UT) TABS Take 6,000 Units by mouth daily.    [provider]  citalopram (CELEXA) 20 MG tablet Take 20-30 mg by mouth daily.    [provider]  COLLAGEN PO Take 10 mLs by mouth daily.    [provider]  lisinopril-hydrochlorothiazide (PRINZIDE,ZESTORETIC) 20-12.5 MG per tablet Take 1 tablet by mouth daily.      [provider]  Multiple Vitamins-Minerals (PRESERVISION AREDS 2 PO) Take 1 capsule by mouth daily.    [provider]  polyvinyl alcohol (LIQUIFILM TEARS) 1.4 % ophthalmic solution Place 1 drop into both eyes as needed for dry eyes.    [provider]  Probiotic Product (PROBIOTIC PO) Take 1 capsule by mouth daily.    [provider]  psyllium (REGULOID) 0.52 g capsule Take 0.52 g by mouth daily.    [provider]  tretinoin (RETIN-A) 0.025 % cream Apply 1 application  topically at  bedtime. 07/20/21   [provider]  Turmeric 500 MG CAPS Take 1,000 mg by mouth daily.    [provider]  zinc gluconate 50 MG tablet Take 50 mg by mouth daily.    [provider]      Allergies    Aspirin, Celebrex [celecoxib], Nsaids, Bactrim [sulfamethoxazole-trimethoprim], Ciprofloxacin hcl, and Flagyl [metronidazole hcl]    Review of Systems   Review of Systems  Gastrointestinal:  Positive for abdominal pain.  All other systems reviewed and are negative.   Physical Exam Updated Vital Signs BP 125/79 (BP Location: Right Arm)   Pulse 76   Temp 98.6 F (37 C) (Oral)   Resp 15   Ht '5\' 2"'$  (1.575 m)   Wt 71.7 kg   SpO2 100%   BMI 28.90 kg/m  Physical Exam Vitals and nursing note reviewed.   87 year old female, resting comfortably and in no acute distress. Vital signs are normal. Oxygen saturation is 100%, which is normal. Head is normocephalic and atraumatic. PERRLA, EOMI. Oropharynx is clear. Neck is nontender and supple without adenopathy or JVD. Back is nontender and there is no CVA tenderness. Lungs are clear without rales, wheezes, or rhonchi. Chest is nontender. Heart has regular rate and rhythm without murmur. Abdomen is soft, flat, with moderate left lower quadrant tenderness.  There is no rebound or guarding. Extremities have no cyanosis or edema, full range of motion is present. Skin is warm and dry  without rash. Neurologic: Mental status is normal, cranial nerves are intact, moves all extremities equally.  ED Results / Procedures / Treatments   Labs (all labs ordered are listed, but only abnormal results are displayed) Labs Reviewed  COMPREHENSIVE METABOLIC PANEL - Abnormal; Notable for the following components:      Result Value   Glucose, Bld 141 (*)    Creatinine, Ser 1.20 (*)    Calcium 10.4 (*)    GFR, Estimated 45 (*)    All other components within normal limits  URINALYSIS, ROUTINE W REFLEX MICROSCOPIC - Abnormal;  Notable for the following components:   Color, Urine COLORLESS (*)    All other components within normal limits  LIPASE, BLOOD  CBC   Radiology CT ABDOMEN PELVIS W CONTRAST  Result Date: 11/14/2021 CLINICAL DATA:  Left lower quadrant abdominal pain EXAM: CT ABDOMEN AND PELVIS WITH CONTRAST TECHNIQUE: Multidetector CT imaging of the abdomen and pelvis was performed using the standard protocol following bolus administration of intravenous contrast. RADIATION DOSE REDUCTION: This exam was performed according to the departmental dose-optimization program which includes automated exposure control, adjustment of the mA and/or kV according to patient size and/or use of iterative reconstruction technique. CONTRAST:  50m OMNIPAQUE IOHEXOL 300 MG/ML  SOLN COMPARISON:  10/12/2021 FINDINGS: Lower chest: Large hiatal hernia unchanged Hepatobiliary: Multiple hepatic cysts, largest in the left lobe, unchanged. Normal gallbladder. Pancreas: Unremarkable. No pancreatic ductal dilatation or surrounding inflammatory changes. Spleen: Normal in size without focal abnormality. Adrenals/Urinary Tract: Adrenal glands are normal. Unchanged appearance of multiple bilateral cystic renal lesions (Bosniak class 1 no follow-up needed). Additionally, the heterogeneous 7 mm lesion near the left lower pole is unchanged Stomach/Bowel: Large hiatal hernia. Mild inflammatory stranding adjacent to the sigmoid colon. No small bowel obstruction. Vascular/Lymphatic: Calcific aortic atherosclerosis. No lymphadenopathy. Reproductive: Status post hysterectomy.  No adnexal mass. Other: None Musculoskeletal: No acute or significant osseous findings. IMPRESSION: 1. Mild inflammatory stranding adjacent to the sigmoid colon, consistent with mild acute diverticulitis. 2. Large hiatal hernia. 3. Unchanged appearance of multiple bilateral cystic renal lesions (Bosniak class 1 no follow-up needed). Additionally, the heterogeneous 7 mm lesion near the left  lower pole is unchanged. Recommend follow-up renal mass protocol CT or MRI in 12 months. Aortic Atherosclerosis (ICD10-I70.0). Electronically Signed   By: KUlyses JarredM.D.   On: 11/14/2021 03:15    Procedures Procedures    Medications Ordered in ED Medications  amoxicillin-clavulanate (AUGMENTIN) 875-125 MG per tablet 1 tablet (has no administration in time range)  iohexol (OMNIPAQUE) 300 MG/ML solution 100 mL (75 mLs Intravenous Contrast Given 11/14/21 0248)    ED Course/ Medical Decision Making/ A&P                           Medical Decision Making Amount and/or Complexity of Data Reviewed Labs: ordered. Radiology: ordered.  Risk Prescription drug management.   Left lower quadrant pain, likely recurrence of diverticulitis.  Differential diagnosis includes, but is not limited to, urolithiasis, pyelonephritis, abdominal aortic aneurysm.  I have reviewed and interpreted her laboratory test, and my interpretation is mild renal insufficiency which is unchanged from baseline, elevated random glucose level, otherwise normal comprehensive metabolic panel, normal lipase, normal CBC, normal urinalysis.  I have ordered CT of abdomen and pelvis to evaluate for presence of diverticulitis.  If diverticulitis is present, she will likely need to have her cholecystectomy delayed until she is no longer having any an acute inflammatory process in  her abdomen.  I have reviewed her past medical records, and ultrasound of abdomen on 09/23/2021 showed cholelithiasis.  CT of abdomen and pelvis on 10/12/2021 showed diverticulosis and aortic atherosclerosis without aneurysm.  CT scan does show sigmoid diverticulitis.  In addition, there is a lesion in left kidney which will need follow-up in 1 year.  I have independently viewed the images, and agree with the radiologist's interpretation.  Patient will need to reschedule her cholecystectomy.  I have sent a message to the on-call surgeon regarding this.  She  currently is not in severe pain and not vomiting, so she is appropriate for outpatient treatment.  I have ordered an initial dose of amoxicillin-clavulanate and ordered a prescription for a 10-day course of same.  I have given strict return precautions.  Final Clinical Impression(s) / ED Diagnoses Final diagnoses:  Diverticulitis, colon  Renal insufficiency  Elevated random blood glucose level  Kidney lesion, native, left    Rx / DC Orders ED Discharge Orders          Ordered    amoxicillin-clavulanate (AUGMENTIN) 875-125 MG tablet  Every 12 hours        11/14/21 2637              Delora Fuel, MD 85/88/50 [id number]

## 2021-11-14 NOTE — Discharge Instructions (Signed)
You may take ibuprofen and/or acetaminophen as needed for pain.  Return to the emergency department if pain is getting worse or if you start running a high fever or start vomiting.  ou will need to reschedule your gallbladder surgery.  I have sent a message to the surgeon on call today about your illness.

## 2021-11-15 ENCOUNTER — Encounter (HOSPITAL_COMMUNITY): Admission: RE | Payer: Self-pay | Source: Ambulatory Visit

## 2021-11-15 ENCOUNTER — Ambulatory Visit (HOSPITAL_COMMUNITY): Admission: RE | Admit: 2021-11-15 | Payer: Medicare Other | Source: Ambulatory Visit | Admitting: Surgery

## 2021-11-15 DIAGNOSIS — K8020 Calculus of gallbladder without cholecystitis without obstruction: Secondary | ICD-10-CM | POA: Diagnosis present

## 2021-11-15 SURGERY — LAPAROSCOPIC CHOLECYSTECTOMY WITH INTRAOPERATIVE CHOLANGIOGRAM
Anesthesia: General

## 2021-11-25 DIAGNOSIS — R935 Abnormal findings on diagnostic imaging of other abdominal regions, including retroperitoneum: Secondary | ICD-10-CM | POA: Diagnosis not present

## 2021-11-25 DIAGNOSIS — K5792 Diverticulitis of intestine, part unspecified, without perforation or abscess without bleeding: Secondary | ICD-10-CM | POA: Diagnosis not present

## 2021-11-25 NOTE — Progress Notes (Addendum)
COVID Vaccine Completed: yes  Date of COVID positive in last 90 days: no  PCP - Lona Kettle, MD Cardiologist - Dr. Oran Rein, saw for PVCs, pt reports workup was normal  Chest x-ray - n/a EKG - 11/05/21 Epic Stress Test - yes more than 20 years ago per pt ECHO - 2019 Cardiac Cath - n/a Pacemaker/ICD device last checked: n/a Spinal Cord Stimulator: n/a  Bowel Prep - no  Sleep Study - n/a CPAP -   Fasting Blood Sugar - n/a Checks Blood Sugar _____ times a day  Blood Thinner Instructions: n/a Aspirin Instructions: Last Dose:  Activity level: Can go up a flight of stairs and perform activities of daily living without stopping and without symptoms of chest pain or shortness of breath.   Anesthesia review:   Patient denies shortness of breath, fever, cough and chest pain at PAT appointment  Patient verbalized understanding of instructions that were given to them at the PAT appointment. Patient was also instructed that they will need to review over the PAT instructions again at home before surgery.

## 2021-11-25 NOTE — Patient Instructions (Addendum)
SURGICAL WAITING ROOM VISITATION Patients having surgery or a procedure may have no more than 2 support people in the waiting area - these visitors may rotate.   Children under the age of 33 must have an adult with them who is not the patient. If the patient needs to stay at the hospital during part of their recovery, the visitor guidelines for inpatient rooms apply. Pre-op nurse will coordinate an appropriate time for 1 support person to accompany patient in pre-op.  This support person may not rotate.    Please refer to the [facility] website for the visitor guidelines for Inpatients (after your surgery is over and you are in a regular room).    Your procedure is scheduled on: 12/06/21   Report to [facility] Main Entrance    Report to admitting at 9:30 AM   Call this number if you have problems the morning of surgery [phone]   Do not eat food :After Midnight.   After Midnight you may have the following liquids until 8:30 AM DAY OF SURGERY  Water Non-Citrus Juices (without pulp, NO RED) Carbonated Beverages Black Coffee (NO MILK/CREAM OR CREAMERS, sugar ok)  Clear Tea (NO MILK/CREAM OR CREAMERS, sugar ok) regular and decaf                             Plain Jell-O (NO RED)                                           Fruit ices (not with fruit pulp, NO RED)                                     Popsicles (NO RED)                                                               Sports drinks like Gatorade (NO RED)                    If you have questions, please contact your surgeon's office.   FOLLOW BOWEL PREP AND ANY ADDITIONAL PRE OP INSTRUCTIONS YOU RECEIVED FROM YOUR SURGEON'S OFFICE!!!     Oral Hygiene is also important to reduce your risk of infection.                                    Remember - BRUSH YOUR TEETH THE MORNING OF SURGERY WITH YOUR REGULAR TOOTHPASTE   Take these medicines the morning of surgery with A SIP OF WATER: Tylenol, Citalopram                                You may not have any metal on your body including hair pins, jewelry, and body piercing             Do not wear make-up, lotions, powders, perfumes, or deodorant  Do not wear nail polish including  gel and S&S, artificial/acrylic nails, or any other type of covering on natural nails including finger and toenails. If you have artificial nails, gel coating, etc. that needs to be removed by a nail salon please have this removed prior to surgery or surgery may need to be canceled/ delayed if the surgeon/ anesthesia feels like they are unable to be safely monitored.   Do not shave  48 hours prior to surgery.    Do not bring valuables to the hospital. Valerie Fox.   Contacts, dentures or bridgework may not be worn into surgery.  DO NOT [place]. PHARMACY WILL DISPENSE MEDICATIONS LISTED ON YOUR MEDICATION LIST TO YOU DURING YOUR ADMISSION Valerie Fox!    Patients discharged on the day of surgery will not be allowed to drive home.  Someone NEEDS to stay with you for the first 24 hours after anesthesia.   Special Instructions: Bring a copy of your healthcare power of attorney and living will documents the day of surgery if you haven't scanned them before.              Please read over the following fact sheets you were given: [name]   If you received a COVID test during your pre-op visit  it is requested that you wear a mask when out in public, stay away from anyone that may not be feeling well and notify your surgeon if you develop symptoms. If you test positive for Covid or have been in contact with anyone that has tested positive in the last 10 days please notify you surgeon.     [name] - Preparing for Surgery Before surgery, you can play an important role.  Because skin is not sterile, your skin needs to be as  free of germs as possible.  You can reduce the number of germs on your skin by washing with CHG (chlorahexidine gluconate) soap before surgery.  CHG is an antiseptic cleaner which kills germs and bonds with the skin to continue killing germs even after washing. Please DO NOT use if you have an allergy to CHG or antibacterial soaps.  If your skin becomes reddened/irritated stop using the CHG and inform your nurse when you arrive at Short Stay. Do not shave (including legs and underarms) for at least 48 hours prior to the first CHG shower.  You may shave your face/neck.  Please follow these instructions carefully:  1.  Shower with CHG Soap the night before surgery and the  morning of surgery.  2.  If you choose to wash your hair, wash your hair first as usual with your normal  shampoo.  3.  After you shampoo, rinse your hair and body thoroughly to remove the shampoo.                             4.  Use CHG as you would any other liquid soap.  You can apply chg directly to the skin and wash.  Gently with a scrungie or clean washcloth.  5.  Apply the CHG Soap to your body ONLY FROM THE NECK DOWN.   Do   not use on face/ open  Wound or open sores. Avoid contact with eyes, ears mouth and   genitals (private parts).                       Wash face,  Genitals (private parts) with your normal soap.             6.  Wash thoroughly, paying special attention to the area where your    surgery  will be performed.  7.  Thoroughly rinse your body with warm water from the neck down.  8.  DO NOT shower/wash with your normal soap after using and rinsing off the CHG Soap.                9.  Pat yourself dry with a clean towel.            10.  Wear clean pajamas.            11.  Place clean sheets on your bed the night of your first shower and do not  sleep with pets. Day of Surgery : Do not apply any lotions/deodorants the morning of surgery.  Please wear clean clothes to the hospital/surgery  center.  FAILURE TO FOLLOW THESE INSTRUCTIONS MAY RESULT IN THE CANCELLATION OF YOUR SURGERY  PATIENT SIGNATURE_________________________________  NURSE SIGNATURE__________________________________  ________________________________________________________________________

## 2021-11-26 ENCOUNTER — Encounter (HOSPITAL_COMMUNITY)
Admission: RE | Admit: 2021-11-26 | Discharge: 2021-11-26 | Disposition: A | Discharge status: Home / Self Care | Payer: Medicare Other | Source: Ambulatory Visit | Attending: Surgery | Admitting: Surgery

## 2021-11-26 ENCOUNTER — Encounter (HOSPITAL_COMMUNITY): Payer: Self-pay

## 2021-11-26 DIAGNOSIS — Z01812 Encounter for preprocedural laboratory examination: Secondary | ICD-10-CM | POA: Diagnosis not present

## 2021-11-26 HISTORY — DX: Personal history of other diseases of the digestive system: Z87.19

## 2021-12-03 ENCOUNTER — Telehealth: Payer: Self-pay

## 2021-12-03 NOTE — Telephone Encounter (Signed)
     Patient  visit on 11/14/2021  at [facility] was for Diverticulitis of large intestine without perforation or abscess without bleeding.  Have you been able to follow up with your primary care physician? Yes  The patient was or was not able to obtain any needed medicine or equipment. Patient filled all prescribed meds.  Are there diet recommendations that you are having difficulty following? No  Patient expresses understanding of discharge instructions and education provided has no other needs at this time.    [facility] Guide   [email or web address]  ?? [phone]   Website: [email or web address]  [email or web address]

## 2021-12-05 ENCOUNTER — Encounter (HOSPITAL_COMMUNITY): Payer: Self-pay | Admitting: Surgery

## 2021-12-06 ENCOUNTER — Other Ambulatory Visit: Payer: Self-pay

## 2021-12-06 ENCOUNTER — Ambulatory Visit (HOSPITAL_COMMUNITY): Payer: Medicare Other | Admitting: Certified Registered Nurse Anesthetist

## 2021-12-06 ENCOUNTER — Ambulatory Visit (HOSPITAL_BASED_OUTPATIENT_CLINIC_OR_DEPARTMENT_OTHER): Payer: Medicare Other | Admitting: Certified Registered Nurse Anesthetist

## 2021-12-06 ENCOUNTER — Ambulatory Visit (HOSPITAL_COMMUNITY)
Admission: RE | Admit: 2021-12-06 | Discharge: 2021-12-06 | Disposition: A | Discharge status: Home / Self Care | Payer: Medicare Other | Source: Ambulatory Visit | Attending: Surgery | Admitting: Surgery

## 2021-12-06 ENCOUNTER — Encounter (HOSPITAL_COMMUNITY)
Admission: RE | Disposition: A | Discharge status: Home / Self Care | Payer: Self-pay | Source: Ambulatory Visit | Attending: Surgery

## 2021-12-06 ENCOUNTER — Ambulatory Visit (HOSPITAL_COMMUNITY): Payer: Medicare Other

## 2021-12-06 ENCOUNTER — Encounter (HOSPITAL_COMMUNITY): Payer: Self-pay | Admitting: Surgery

## 2021-12-06 DIAGNOSIS — K219 Gastro-esophageal reflux disease without esophagitis: Secondary | ICD-10-CM | POA: Insufficient documentation

## 2021-12-06 DIAGNOSIS — Z90710 Acquired absence of both cervix and uterus: Secondary | ICD-10-CM | POA: Diagnosis not present

## 2021-12-06 DIAGNOSIS — K449 Diaphragmatic hernia without obstruction or gangrene: Secondary | ICD-10-CM | POA: Diagnosis not present

## 2021-12-06 DIAGNOSIS — I10 Essential (primary) hypertension: Secondary | ICD-10-CM | POA: Insufficient documentation

## 2021-12-06 DIAGNOSIS — K8010 Calculus of gallbladder with chronic cholecystitis without obstruction: Secondary | ICD-10-CM | POA: Diagnosis not present

## 2021-12-06 DIAGNOSIS — K8020 Calculus of gallbladder without cholecystitis without obstruction: Secondary | ICD-10-CM | POA: Diagnosis not present

## 2021-12-06 DIAGNOSIS — Z9049 Acquired absence of other specified parts of digestive tract: Secondary | ICD-10-CM | POA: Diagnosis not present

## 2021-12-06 HISTORY — PX: CHOLECYSTECTOMY: SHX55

## 2021-12-06 SURGERY — LAPAROSCOPIC CHOLECYSTECTOMY WITH INTRAOPERATIVE CHOLANGIOGRAM
Anesthesia: General

## 2021-12-06 MED ORDER — PROPOFOL 10 MG/ML IV BOLUS
INTRAVENOUS | Status: DC | PRN
  Administered 2021-12-06: 100 mg via INTRAVENOUS

## 2021-12-06 MED ORDER — ONDANSETRON HCL 4 MG/2ML IJ SOLN
INTRAMUSCULAR | Status: DC | PRN
  Administered 2021-12-06: 4 mg via INTRAVENOUS

## 2021-12-06 MED ORDER — ONDANSETRON HCL 4 MG/2ML IJ SOLN
INTRAMUSCULAR | Status: AC
  Filled 2021-12-06: qty 2

## 2021-12-06 MED ORDER — TRAMADOL HCL 50 MG PO TABS: 50.0000 mg | ORAL_TABLET | Freq: Four times a day (QID) | ORAL | 0 refills | Status: DC | PRN

## 2021-12-06 MED ORDER — ACETAMINOPHEN 10 MG/ML IV SOLN
INTRAVENOUS | Status: AC
  Filled 2021-12-06: qty 100

## 2021-12-06 MED ORDER — ACETAMINOPHEN 10 MG/ML IV SOLN
INTRAVENOUS | Status: DC | PRN
  Administered 2021-12-06: 1000 mg via INTRAVENOUS

## 2021-12-06 MED ORDER — SUGAMMADEX SODIUM 200 MG/2ML IV SOLN
INTRAVENOUS | Status: DC | PRN
  Administered 2021-12-06: 200 mg via INTRAVENOUS

## 2021-12-06 MED ORDER — CHLORHEXIDINE GLUCONATE 0.12 % MT SOLN
15.0000 mL | Freq: Once | OROMUCOSAL | Status: AC
  Administered 2021-12-06: 15 mL via OROMUCOSAL

## 2021-12-06 MED ORDER — ONDANSETRON HCL 4 MG/2ML IJ SOLN: 4.0000 mg | Freq: Once | INTRAMUSCULAR | Status: DC | PRN

## 2021-12-06 MED ORDER — PHENYLEPHRINE 80 MCG/ML (10ML) SYRINGE FOR IV PUSH (FOR BLOOD PRESSURE SUPPORT)
PREFILLED_SYRINGE | INTRAVENOUS | Status: AC
  Filled 2021-12-06: qty 10

## 2021-12-06 MED ORDER — CHLORHEXIDINE GLUCONATE CLOTH 2 % EX PADS: 6.0000 | MEDICATED_PAD | Freq: Once | CUTANEOUS | Status: DC

## 2021-12-06 MED ORDER — IOHEXOL 300 MG/ML  SOLN
INTRAMUSCULAR | Status: DC | PRN
  Administered 2021-12-06: 5 mL

## 2021-12-06 MED ORDER — DEXAMETHASONE SODIUM PHOSPHATE 10 MG/ML IJ SOLN
INTRAMUSCULAR | Status: AC
  Filled 2021-12-06: qty 1

## 2021-12-06 MED ORDER — OXYCODONE HCL 5 MG/5ML PO SOLN: 5.0000 mg | Freq: Once | ORAL | Status: DC | PRN

## 2021-12-06 MED ORDER — BUPIVACAINE-EPINEPHRINE (PF) 0.5% -1:200000 IJ SOLN
INTRAMUSCULAR | Status: AC
  Filled 2021-12-06: qty 30

## 2021-12-06 MED ORDER — OXYCODONE HCL 5 MG PO TABS: 5.0000 mg | ORAL_TABLET | Freq: Once | ORAL | Status: DC | PRN

## 2021-12-06 MED ORDER — TRAMADOL HCL 50 MG PO TABS
ORAL_TABLET | ORAL | Status: AC
  Filled 2021-12-06: qty 1

## 2021-12-06 MED ORDER — EPHEDRINE 5 MG/ML INJ
INTRAVENOUS | Status: AC
  Filled 2021-12-06: qty 5

## 2021-12-06 MED ORDER — EPHEDRINE SULFATE-NACL 50-0.9 MG/10ML-% IV SOSY
PREFILLED_SYRINGE | INTRAVENOUS | Status: DC | PRN
  Administered 2021-12-06: 5 mg via INTRAVENOUS
  Administered 2021-12-06: 7.5 mg via INTRAVENOUS
  Administered 2021-12-06: 5 mg via INTRAVENOUS

## 2021-12-06 MED ORDER — TRAMADOL HCL 50 MG PO TABS
50.0000 mg | ORAL_TABLET | Freq: Once | ORAL | Status: AC
  Administered 2021-12-06: 50 mg via ORAL

## 2021-12-06 MED ORDER — LACTATED RINGERS IV SOLN: INTRAVENOUS | Status: DC

## 2021-12-06 MED ORDER — HYDROMORPHONE HCL 1 MG/ML IJ SOLN: 0.2500 mg | INTRAMUSCULAR | Status: DC | PRN

## 2021-12-06 MED ORDER — FENTANYL CITRATE (PF) 100 MCG/2ML IJ SOLN
INTRAMUSCULAR | Status: AC
  Filled 2021-12-06: qty 2

## 2021-12-06 MED ORDER — LIDOCAINE 2% (20 MG/ML) 5 ML SYRINGE
INTRAMUSCULAR | Status: DC | PRN
  Administered 2021-12-06: 50 mg via INTRAVENOUS

## 2021-12-06 MED ORDER — DEXAMETHASONE SODIUM PHOSPHATE 4 MG/ML IJ SOLN
INTRAMUSCULAR | Status: DC | PRN
  Administered 2021-12-06: 5 mg via INTRAVENOUS

## 2021-12-06 MED ORDER — SUCCINYLCHOLINE CHLORIDE 200 MG/10ML IV SOSY
PREFILLED_SYRINGE | INTRAVENOUS | Status: DC | PRN
  Administered 2021-12-06: 100 mg via INTRAVENOUS

## 2021-12-06 MED ORDER — ORAL CARE MOUTH RINSE: 15.0000 mL | Freq: Once | OROMUCOSAL | Status: AC

## 2021-12-06 MED ORDER — ROCURONIUM BROMIDE 10 MG/ML (PF) SYRINGE
PREFILLED_SYRINGE | INTRAVENOUS | Status: AC
  Filled 2021-12-06: qty 10

## 2021-12-06 MED ORDER — CEFAZOLIN SODIUM-DEXTROSE 2-4 GM/100ML-% IV SOLN
2.0000 g | INTRAVENOUS | Status: AC
  Administered 2021-12-06: 2 g via INTRAVENOUS
  Filled 2021-12-06: qty 100

## 2021-12-06 MED ORDER — SODIUM CHLORIDE (PF) 0.9 % IJ SOLN
INTRAMUSCULAR | Status: AC
  Filled 2021-12-06: qty 50

## 2021-12-06 MED ORDER — AMISULPRIDE (ANTIEMETIC) 5 MG/2ML IV SOLN: 10.0000 mg | Freq: Once | INTRAVENOUS | Status: DC | PRN

## 2021-12-06 MED ORDER — BUPIVACAINE-EPINEPHRINE 0.5% -1:200000 IJ SOLN
INTRAMUSCULAR | Status: DC | PRN
  Administered 2021-12-06: 20 mL

## 2021-12-06 MED ORDER — FENTANYL CITRATE (PF) 100 MCG/2ML IJ SOLN
INTRAMUSCULAR | Status: DC | PRN
  Administered 2021-12-06 (×4): 50 ug via INTRAVENOUS

## 2021-12-06 MED ORDER — LIDOCAINE HCL (PF) 2 % IJ SOLN
INTRAMUSCULAR | Status: AC
  Filled 2021-12-06: qty 5

## 2021-12-06 MED ORDER — PHENYLEPHRINE 80 MCG/ML (10ML) SYRINGE FOR IV PUSH (FOR BLOOD PRESSURE SUPPORT)
PREFILLED_SYRINGE | INTRAVENOUS | Status: DC | PRN
  Administered 2021-12-06: 80 ug via INTRAVENOUS
  Administered 2021-12-06: 120 ug via INTRAVENOUS
  Administered 2021-12-06: 80 ug via INTRAVENOUS

## 2021-12-06 MED ORDER — ROCURONIUM BROMIDE 10 MG/ML (PF) SYRINGE
PREFILLED_SYRINGE | INTRAVENOUS | Status: DC | PRN
  Administered 2021-12-06: 10 mg via INTRAVENOUS
  Administered 2021-12-06: 40 mg via INTRAVENOUS

## 2021-12-06 MED ORDER — PROPOFOL 10 MG/ML IV BOLUS
INTRAVENOUS | Status: AC
  Filled 2021-12-06: qty 20

## 2021-12-06 MED ORDER — DEXMEDETOMIDINE HCL IN NACL 80 MCG/20ML IV SOLN
INTRAVENOUS | Status: DC | PRN
  Administered 2021-12-06 (×2): 4 ug via BUCCAL

## 2021-12-06 SURGICAL SUPPLY — 42 items
ADH SKN CLS APL DERMABOND .7 (GAUZE/BANDAGES/DRESSINGS)
APL PRP STRL LF DISP 70% ISPRP (MISCELLANEOUS) ×2
APPLIER CLIP ROT 10 11.4 M/L (STAPLE) ×1
APR CLP MED LRG 11.4X10 (STAPLE) ×1
BAG COUNTER SPONGE SURGICOUNT (BAG) IMPLANT
BAG SPNG CNTER NS LX DISP (BAG)
CABLE HIGH FREQUENCY MONO STRZ (ELECTRODE) ×2 IMPLANT
CHLORAPREP W/TINT 26 (MISCELLANEOUS) ×4 IMPLANT
CLIP APPLIE ROT 10 11.4 M/L (STAPLE) ×2 IMPLANT
COVER MAYO STAND XLG (MISCELLANEOUS) ×2 IMPLANT
COVER SURGICAL LIGHT HANDLE (MISCELLANEOUS) ×2 IMPLANT
DERMABOND ADVANCED .7 DNX12 (GAUZE/BANDAGES/DRESSINGS) IMPLANT
DRAPE C-ARM 42X120 X-RAY (DRAPES) ×2 IMPLANT
ELECT REM PT RETURN 15FT ADLT (MISCELLANEOUS) ×2 IMPLANT
GAUZE SPONGE 2X2 8PLY STRL LF (GAUZE/BANDAGES/DRESSINGS) ×2 IMPLANT
GLOVE SURG ORTHO 8.0 STRL STRW (GLOVE) ×2 IMPLANT
GLOVE SURG SYN 7.5  E (GLOVE) ×2
GLOVE SURG SYN 7.5 E (GLOVE) ×2 IMPLANT
GLOVE SURG SYN 7.5 PF PI (GLOVE) ×4 IMPLANT
GOWN STRL REUS W/ TWL XL LVL3 (GOWN DISPOSABLE) ×4 IMPLANT
GOWN STRL REUS W/TWL XL LVL3 (GOWN DISPOSABLE) ×2
HEMOSTAT SURGICEL 4X8 (HEMOSTASIS) IMPLANT
IRRIG SUCT STRYKERFLOW 2 WTIP (MISCELLANEOUS) ×1
IRRIGATION SUCT STRKRFLW 2 WTP (MISCELLANEOUS) ×2 IMPLANT
KIT BASIN OR (CUSTOM PROCEDURE TRAY) ×2 IMPLANT
KIT TURNOVER KIT A (KITS) IMPLANT
PENCIL SMOKE EVACUATOR (MISCELLANEOUS) IMPLANT
SCISSORS LAP 5X35 DISP (ENDOMECHANICALS) ×2 IMPLANT
SET CHOLANGIOGRAPH MIX (MISCELLANEOUS) ×2 IMPLANT
SET TUBE SMOKE EVAC HIGH FLOW (TUBING) IMPLANT
SLEEVE Z-THREAD 5X100MM (TROCAR) ×2 IMPLANT
SPIKE FLUID TRANSFER (MISCELLANEOUS) ×2 IMPLANT
STRIP CLOSURE SKIN 1/2X4 (GAUZE/BANDAGES/DRESSINGS) IMPLANT
SUT MNCRL AB 4-0 PS2 18 (SUTURE) ×2 IMPLANT
SYS BAG RETRIEVAL 10MM (BASKET) ×1
SYSTEM BAG RETRIEVAL 10MM (BASKET) ×2 IMPLANT
TOWEL OR 17X26 10 PK STRL BLUE (TOWEL DISPOSABLE) ×2 IMPLANT
TOWEL OR NON WOVEN STRL DISP B (DISPOSABLE) ×2 IMPLANT
TRAY LAPAROSCOPIC (CUSTOM PROCEDURE TRAY) ×2 IMPLANT
TROCAR 11X100 Z THREAD (TROCAR) ×2 IMPLANT
TROCAR BALLN 12MMX100 BLUNT (TROCAR) ×2 IMPLANT
TROCAR Z-THREAD OPTICAL 5X100M (TROCAR) ×2 IMPLANT

## 2021-12-06 NOTE — Anesthesia Postprocedure Evaluation (Signed)
Anesthesia Post Note  Patient: Valerie Fox  Procedure(s) Performed: LAPAROSCOPIC CHOLECYSTECTOMY WITH INTRAOPERATIVE CHOLANGIOGRAM     Patient location during evaluation: PACU Anesthesia Type: General Level of consciousness: awake and alert and oriented Pain management: pain level controlled Vital Signs Assessment: post-procedure vital signs reviewed and stable Respiratory status: spontaneous breathing, nonlabored ventilation and respiratory function stable Cardiovascular status: blood pressure returned to baseline and stable Postop Assessment: no apparent nausea or vomiting Anesthetic complications: no   No notable events documented.  Last Vitals:  Vitals:   12/06/21 1450 12/06/21 1500  BP:  135/76  Pulse:  75  Resp:  16  Temp: (!) 36.1 C   SpO2:  98%    Last Pain:  Vitals:   12/06/21 1500  TempSrc:   PainSc: 0-No pain                 Jaanai Salemi A.

## 2021-12-06 NOTE — Anesthesia Procedure Notes (Signed)
Procedure Name: Intubation Date/Time: 12/06/2021 12:50 PM  Performed by: Claudia Desanctis, CRNAPre-anesthesia Checklist: Patient identified, Emergency Drugs available, Suction available and Patient being monitored Patient Re-evaluated:Patient Re-evaluated prior to induction Oxygen Delivery Method: Circle system utilized Preoxygenation: Pre-oxygenation with 100% oxygen Induction Type: IV induction, Rapid sequence and Cricoid Pressure applied Laryngoscope Size: 2 and Miller Grade View: Grade I Tube type: Oral Tube size: 7.0 mm Number of attempts: 1 Airway Equipment and Method: Stylet Placement Confirmation: ETT inserted through vocal cords under direct vision, positive ETCO2 and breath sounds checked- equal and bilateral Secured at: 21 cm Tube secured with: Tape Dental Injury: Teeth and Oropharynx as per pre-operative assessment

## 2021-12-06 NOTE — Interval H&P Note (Signed)
History and Physical Interval Note:  12/06/2021 12:23 PM  Valerie Fox  has presented today for surgery, with the diagnosis of SYMPTOMATIC CHOLELITHIASIS.  The various methods of treatment have been discussed with the patient and family. After consideration of risks, benefits and other options for treatment, the patient has consented to    Procedure(s): LAPAROSCOPIC CHOLECYSTECTOMY WITH INTRAOPERATIVE CHOLANGIOGRAM (N/A) as a surgical intervention.    The patient's history has been reviewed, patient examined, no change in status, stable for surgery.  I have reviewed the patient's chart and labs.  Questions were answered to the patient's satisfaction.    Armandina Gemma, [facility] [place] Office: [place]

## 2021-12-06 NOTE — Anesthesia Preprocedure Evaluation (Addendum)
Anesthesia Evaluation  Patient identified by MRN, date of birth, ID band Patient awake    Reviewed: Allergy & Precautions, NPO status , Patient's Chart, lab work & pertinent test results, reviewed documented beta blocker date and time   Airway Mallampati: III  TM Distance: >3 FB Neck ROM: Full  Mouth opening: Limited Mouth Opening  Dental  (+) Dental Advisory Given, Teeth Intact, Partial Lower, Partial Upper   Pulmonary neg pulmonary ROS   Pulmonary exam normal breath sounds clear to auscultation       Cardiovascular hypertension, Pt. on medications Normal cardiovascular exam Rhythm:Regular Rate:Normal     Neuro/Psych  PSYCHIATRIC DISORDERS Anxiety Depression    negative neurological ROS     GI/Hepatic Neg liver ROS, hiatal hernia,GERD  Medicated,,Cholelithiasis- symptomatic   Endo/Other  negative endocrine ROS    Renal/GU negative Renal ROS  negative genitourinary   Musculoskeletal  (+) Arthritis , Osteoarthritis,    Abdominal  (+)  Abdomen: tender.   Peds  Hematology negative hematology ROS (+)   Anesthesia Other Findings   Reproductive/Obstetrics                             Anesthesia Physical Anesthesia Plan  ASA: 2  Anesthesia Plan: General   Post-op Pain Management: Ofirmev IV (intra-op)* and Precedex   Induction: Intravenous, Rapid sequence and Cricoid pressure planned  PONV Risk Score and Plan: 4 or greater and Treatment may vary due to age or medical condition, Ondansetron and Dexamethasone  Airway Management Planned: Oral ETT  Additional Equipment: None  Intra-op Plan:   Post-operative Plan: Extubation in OR  Informed Consent: I have reviewed the patients History and Physical, chart, labs and discussed the procedure including the risks, benefits and alternatives for the proposed anesthesia with the patient or authorized representative who has indicated his/her  understanding and acceptance.     Dental advisory given and Interpreter used for interveiw  Plan Discussed with: CRNA and Anesthesiologist  Anesthesia Plan Comments:        Anesthesia Quick Evaluation

## 2021-12-06 NOTE — Discharge Instructions (Signed)
[facility]  LAPAROSCOPIC SURGERY:  POST-OP INSTRUCTIONS  Always review your discharge instruction sheet given to you by the facility where your surgery was performed.  A prescription for pain medication may be given to you upon discharge.  Take your pain medication as prescribed.  If narcotic pain medicine is not needed, then you may take acetaminophen (Tylenol) or ibuprofen (Advil) as needed.  Take your usually prescribed medications unless otherwise directed.  If you need a refill on your pain medication, please contact your pharmacy.  They will contact our office to request authorization. Prescriptions will not be filled after 5 P.M. or on weekends.  You should follow a light diet the first few days after arrival home, such as soup and crackers or toast.  Be sure to include plenty of fluids daily.  Most patients will experience some swelling and bruising in the area of the incisions.  Ice packs will help.  Swelling and bruising can take several days to resolve.   It is common to experience some constipation after surgery.  Increasing fluid intake and taking a stool softener (such as Colace) will usually help or prevent this problem from occurring.  A mild laxative (Milk of Magnesia or Miralax) should be taken according to package instructions if there has been no bowel movement after 48 hours.  You will likely have Dermabond (topical glue) over your incisions.  This seals the incisions and allows you to bathe and shower at any time after your surgery.  Glue should remain in place for up to 10 days.  It may be removed after 10 days by pealing off the Dermabond material or using Vaseline or naval jelly to remove.  If you have steri-strips over your incisions, you may remove the gauze bandage on the second day after surgery, and you may shower at that time.  Leave your steri-strips (small skin tapes) in place directly over the incision.  These strips should remain on the  skin for 5-7 days and then be removed.  You may get them wet in the shower and pat them dry.  Any sutures or staples will be removed at the office during your follow-up visit.  ACTIVITIES:  You may resume regular (light) daily activities beginning the next day - such as daily self-care, walking, climbing stairs - gradually increasing activities as tolerated.  You may have sexual intercourse when it is comfortable.  Refrain from any heavy lifting or straining until approved by your doctor.  You may drive when you are no longer taking prescription pain medication, when you can comfortably wear a seatbelt, and when you can safely maneuver your car and apply brakes.  You should see your doctor in the office for a follow-up appointment approximately 2-3 weeks after your surgery.  Make sure that you call for this appointment within a day or two after you arrive home to insure a convenient appointment time.  WHEN TO CALL YOUR DOCTOR: Fever over 101.0 Inability to urinate Continued bleeding from incision Increased pain, redness, or drainage from the incision Increasing abdominal pain  The clinic staff is available to answer your questions during regular business hours.  Please don't hesitate to call and ask to speak to one of the nurses for clinical concerns.  If you have a medical emergency, go to the nearest emergency room or call 911.  A surgeon from [facility] is always on call for the hospital.  Jaimeson Gopal, MD [facility] Office: [phone] Toll Free:    [phone] FAX [phone]  Website: [email or web address] 

## 2021-12-06 NOTE — Op Note (Signed)
Procedure Note  Pre-operative Diagnosis:  Symptomatic cholelithiasis Post-operative Diagnosis:  same  Surgeon:    - Armandina Gemma, MD- Attending - Winfield Rast, MD- Resident  Assistant:  NA  Procedure:  Laparoscopic cholecystectomy with intra-operative cholangiography  Anesthesia:  General  Estimated Blood Loss:  minimal  Drains: None         Specimen: gallbladder to pathology  Indications:  32F with symptomatic cholelithiasis  Procedure description: The patient was seen in the pre-op holding area. The risks, benefits, complications, treatment options, and expected outcomes were previously discussed with the patient. The patient agreed with the proposed plan and has signed the informed consent form.  The patient was transported to operating room # 5 at the [facility]. The patient was placed in the supine position on the operating room table. Following induction of general anesthesia, the abdomen was prepped and draped in the usual aseptic fashion.  An incision was made in the skin near the umbilicus. The midline fascia was incised and the peritoneal cavity was entered and a Hasson cannula was introduced under direct vision. The cannula was secured with a 0-Vicryl pursestring suture. Pneumoperitoneum was established with carbon dioxide. Additional cannulae were introduced under direct vision along the right costal margin in the midline, mid-clavicular line, and anterior axillary line.   The gallbladder was identified and the fundus grasped and retracted cephalad. Adhesions were taken down bluntly and the electrocautery was utilized as needed, taking care not to involve any adjacent structures. The infundibulum was grasped and retracted laterally, exposing the peritoneum overlying the triangle of Calot. The peritoneum was incised and structures exposed with blunt dissection. The cystic duct was clearly identified, bluntly dissected circumferentially, and clipped at the neck of  the gallbladder.  An incision was made in the cystic duct and the cholangiogram catheter introduced. The catheter was secured using an ligaclip.  Real-time cholangiography was performed using C-arm fluoroscopy.  There was rapid filling of a normal caliber common bile duct.  There was reflux of contrast into the left and right hepatic ductal systems.  There was free flow distally into the duodenum without filling defect or obstruction.  The catheter was removed from the peritoneal cavity.  The cystic duct was then ligated with ligaclips and divided. The cystic artery was identified, dissected circumferentially, ligated with ligaclips, and divided.  The gallbladder was dissected away from the gallbladder bed using the electrocautery for hemostasis. The gallbladder was completely removed from the liver and placed into an endocatch bag. The gallbladder was removed in the endocatch bag through the umbilical port site and submitted to pathology for review.  The right upper quadrant was irrigated and the gallbladder bed was inspected. Hemostasis was achieved with the electrocautery.  Cannulae were removed under direct vision and good hemostasis was noted. Pneumoperitoneum was released and the majority of the carbon dioxide evacuated. The umbilical wound was irrigated and the fascia was then closed with the pursestring suture.  Local anesthetic was infiltrated at all port sites. Skin incisions were closed with 4-0 Monocril subcuticular sutures and Dermabond was applied.  Instrument, sponge, and needle counts were correct at the conclusion of the case.  The patient was awakened from anesthesia and brought to the recovery room in stable condition.  The patient tolerated the procedure well.    I was present throughout the entire procedure. I agree with the operative report as documented by the resident.  Armandina Gemma, [facility] Office: [phone]

## 2021-12-06 NOTE — Transfer of Care (Signed)
Immediate Anesthesia Transfer of Care Note  Patient: Valerie Fox  Procedure(s) Performed: LAPAROSCOPIC CHOLECYSTECTOMY WITH INTRAOPERATIVE CHOLANGIOGRAM  Patient Location: PACU  Anesthesia Type:General  Level of Consciousness: awake and patient cooperative  Airway & Oxygen Therapy: Patient Spontanous Breathing  Post-op Assessment: Report given to RN and Post -op Vital signs reviewed and stable  Post vital signs: Reviewed and stable  Last Vitals:  Vitals Value Taken Time  BP 149/82 12/06/21 1413  Temp    Pulse 75 12/06/21 1415  Resp 9 12/06/21 1415  SpO2 98 % 12/06/21 1415  Vitals shown include unvalidated device data.  Last Pain:  Vitals:   12/06/21 0937  TempSrc: Oral  PainSc: 0-No pain         Complications: No notable events documented.

## 2021-12-07 ENCOUNTER — Encounter (HOSPITAL_COMMUNITY): Payer: Self-pay | Admitting: Surgery

## 2021-12-08 LAB — SURGICAL PATHOLOGY

## 2021-12-14 DIAGNOSIS — R9389 Abnormal findings on diagnostic imaging of other specified body structures: Secondary | ICD-10-CM | POA: Diagnosis not present

## 2021-12-14 DIAGNOSIS — M810 Age-related osteoporosis without current pathological fracture: Secondary | ICD-10-CM | POA: Diagnosis not present

## 2021-12-14 DIAGNOSIS — I10 Essential (primary) hypertension: Secondary | ICD-10-CM | POA: Diagnosis not present

## 2021-12-14 DIAGNOSIS — K9189 Other postprocedural complications and disorders of digestive system: Secondary | ICD-10-CM | POA: Diagnosis not present

## 2021-12-14 DIAGNOSIS — N1831 Chronic kidney disease, stage 3a: Secondary | ICD-10-CM | POA: Diagnosis not present

## 2021-12-14 DIAGNOSIS — Q6100 Congenital renal cyst, unspecified: Secondary | ICD-10-CM | POA: Diagnosis not present

## 2021-12-14 DIAGNOSIS — Z0000 Encounter for general adult medical examination without abnormal findings: Secondary | ICD-10-CM | POA: Diagnosis not present

## 2021-12-29 DIAGNOSIS — Z9049 Acquired absence of other specified parts of digestive tract: Secondary | ICD-10-CM | POA: Insufficient documentation

## 2022-01-02 DIAGNOSIS — R300 Dysuria: Secondary | ICD-10-CM | POA: Diagnosis not present

## 2022-01-02 DIAGNOSIS — N390 Urinary tract infection, site not specified: Secondary | ICD-10-CM | POA: Diagnosis not present

## 2022-01-03 DIAGNOSIS — Z1231 Encounter for screening mammogram for malignant neoplasm of breast: Secondary | ICD-10-CM | POA: Diagnosis not present

## 2022-01-06 DIAGNOSIS — N390 Urinary tract infection, site not specified: Secondary | ICD-10-CM | POA: Diagnosis not present

## 2022-01-06 DIAGNOSIS — K5900 Constipation, unspecified: Secondary | ICD-10-CM | POA: Diagnosis not present

## 2022-01-06 DIAGNOSIS — M549 Dorsalgia, unspecified: Secondary | ICD-10-CM | POA: Diagnosis not present

## 2022-02-21 DIAGNOSIS — N3020 Other chronic cystitis without hematuria: Secondary | ICD-10-CM | POA: Diagnosis not present

## 2022-02-21 DIAGNOSIS — N281 Cyst of kidney, acquired: Secondary | ICD-10-CM | POA: Diagnosis not present

## 2022-05-30 DIAGNOSIS — D225 Melanocytic nevi of trunk: Secondary | ICD-10-CM | POA: Diagnosis not present

## 2022-05-30 DIAGNOSIS — I781 Nevus, non-neoplastic: Secondary | ICD-10-CM | POA: Diagnosis not present

## 2022-05-30 DIAGNOSIS — L738 Other specified follicular disorders: Secondary | ICD-10-CM | POA: Diagnosis not present

## 2022-09-29 ENCOUNTER — Other Ambulatory Visit: Payer: Self-pay

## 2022-09-29 ENCOUNTER — Emergency Department (HOSPITAL_COMMUNITY): Payer: Medicare Other

## 2022-09-29 ENCOUNTER — Encounter (HOSPITAL_COMMUNITY): Payer: Self-pay

## 2022-09-29 ENCOUNTER — Emergency Department (HOSPITAL_COMMUNITY)
Admission: EM | Admit: 2022-09-29 | Discharge: 2022-09-29 | Disposition: A | Discharge status: Home / Self Care | Payer: Medicare Other | Attending: Emergency Medicine | Admitting: Emergency Medicine

## 2022-09-29 DIAGNOSIS — Z79899 Other long term (current) drug therapy: Secondary | ICD-10-CM | POA: Insufficient documentation

## 2022-09-29 DIAGNOSIS — Z23 Encounter for immunization: Secondary | ICD-10-CM | POA: Insufficient documentation

## 2022-09-29 DIAGNOSIS — M20011 Mallet finger of right finger(s): Secondary | ICD-10-CM | POA: Diagnosis not present

## 2022-09-29 DIAGNOSIS — S50319A Abrasion of unspecified elbow, initial encounter: Secondary | ICD-10-CM | POA: Diagnosis not present

## 2022-09-29 DIAGNOSIS — M542 Cervicalgia: Secondary | ICD-10-CM | POA: Diagnosis not present

## 2022-09-29 DIAGNOSIS — W19XXXA Unspecified fall, initial encounter: Secondary | ICD-10-CM | POA: Insufficient documentation

## 2022-09-29 DIAGNOSIS — S0990XA Unspecified injury of head, initial encounter: Secondary | ICD-10-CM | POA: Insufficient documentation

## 2022-09-29 DIAGNOSIS — I10 Essential (primary) hypertension: Secondary | ICD-10-CM | POA: Insufficient documentation

## 2022-09-29 DIAGNOSIS — M25521 Pain in right elbow: Secondary | ICD-10-CM | POA: Diagnosis not present

## 2022-09-29 DIAGNOSIS — M1811 Unilateral primary osteoarthritis of first carpometacarpal joint, right hand: Secondary | ICD-10-CM | POA: Diagnosis not present

## 2022-09-29 DIAGNOSIS — S50311A Abrasion of right elbow, initial encounter: Secondary | ICD-10-CM | POA: Diagnosis not present

## 2022-09-29 DIAGNOSIS — M19031 Primary osteoarthritis, right wrist: Secondary | ICD-10-CM | POA: Diagnosis not present

## 2022-09-29 DIAGNOSIS — S60511A Abrasion of right hand, initial encounter: Secondary | ICD-10-CM | POA: Diagnosis not present

## 2022-09-29 DIAGNOSIS — M47812 Spondylosis without myelopathy or radiculopathy, cervical region: Secondary | ICD-10-CM | POA: Diagnosis not present

## 2022-09-29 DIAGNOSIS — M47811 Spondylosis without myelopathy or radiculopathy, occipito-atlanto-axial region: Secondary | ICD-10-CM | POA: Diagnosis not present

## 2022-09-29 LAB — BASIC METABOLIC PANEL
Anion gap: 13 (ref 5–15)
BUN: 28 mg/dL — ABNORMAL HIGH (ref 8–23)
CO2: 24 mmol/L (ref 22–32)
Calcium: 10.4 mg/dL — ABNORMAL HIGH (ref 8.9–10.3)
Chloride: 101 mmol/L (ref 98–111)
Creatinine, Ser: 1.33 mg/dL — ABNORMAL HIGH (ref 0.44–1.00)
GFR, Estimated: 39 mL/min — ABNORMAL LOW (ref >60)
Glucose, Bld: 174 mg/dL — ABNORMAL HIGH (ref 70–99)
Potassium: 3.7 mmol/L (ref 3.5–5.1)
Sodium: 138 mmol/L (ref 135–145)

## 2022-09-29 LAB — MAGNESIUM: Magnesium: 2.3 mg/dL (ref 1.7–2.4)

## 2022-09-29 MED ORDER — TETANUS-DIPHTH-ACELL PERTUSSIS 5-2.5-18.5 LF-MCG/0.5 IM SUSY
0.5000 mL | PREFILLED_SYRINGE | Freq: Once | INTRAMUSCULAR | Status: AC
  Administered 2022-09-29: 0.5 mL via INTRAMUSCULAR
  Filled 2022-09-29: qty 0.5

## 2022-09-29 MED ORDER — ACETAMINOPHEN 500 MG PO TABS
1000.0000 mg | ORAL_TABLET | Freq: Once | ORAL | Status: AC
  Administered 2022-09-29: 1000 mg via ORAL
  Filled 2022-09-29: qty 2

## 2022-09-29 NOTE — ED Notes (Signed)
Patient reports improvement in discomfort at this time

## 2022-09-29 NOTE — ED Notes (Signed)
I irrigated patient wound and put a dry dressing on it per MD

## 2022-09-29 NOTE — Discharge Instructions (Addendum)
We evaluated you for your fall.  Your x-rays and CT scans did not show any fracture or serious injury.  We gave you a tetanus shot and cleaned out your wound.  Please follow-up with your primary doctor and be careful when walking.  Please take 1000 mg of Tylenol every 6 hours for your pain.  You can also apply ice packs to areas of soreness.  If you have any new or worsening symptoms such as trouble walking, fevers or chills, numbness or tingling, weakness, severe headaches, vomiting, or any other new symptoms, please return to the emergency department.

## 2022-09-29 NOTE — ED Provider Triage Note (Signed)
Emergency Medicine Provider Triage Evaluation Note  Valerie Fox , a 88 y.o. female  was evaluated in triage.  Pt complains of head injury after falling from a height of 1 step.  She states she lost balance causing her to fall backwards onto concrete floor.  Complains of right elbow pain, right-sided neck pain.  No loss of consciousness.  Not on anticoagulation.  Review of Systems  Positive: As above Negative: As above  Physical Exam  BP 107/81 (BP Location: Left Arm)   Pulse 88   Temp 98.9 F (37.2 C) (Oral)   Resp (!) 21   SpO2 98%  Gen:   Awake, no distress   Resp:  Normal effort  MSK:   Moves extremities without difficulty  Other:    Medical Decision Making  Medically screening exam initiated at 3:49 PM.  Appropriate orders placed.  Valerie Fox was informed that the remainder of the evaluation will be completed by another provider, this initial triage assessment does not replace that evaluation, and the importance of remaining in the ED until their evaluation is complete.     Valerie [place], PA-C 09/29/22 1550

## 2022-09-29 NOTE — ED Triage Notes (Signed)
Patient reports losing her balance and falling backward hitting her head on the concrete. Patient denies LOC and does not take blood thinners. C/o headache.  VSS, NAD

## 2022-10-02 NOTE — ED Provider Notes (Signed)
[facility]MENT AT [facility] Provider Note  CSN: [id number] Arrival date & time: 09/29/22 1512  Chief Complaint(s) Fall  HPI Valerie Fox is a 88 y.o. female without significant past medical history presenting to the emergency department after fall.  Patient had a fall while opening a door.  Hit the back of the head on the ground.  Also hit right elbow.  Reports some right-sided neck pain.  No loss of consciousness.  This occurred today.  No preceding presyncope.  No chest pain, abdominal pain.  Reports some mild right wrist pain as well.  Has an abrasion of the right elbow.   Past Medical History Past Medical History:  Diagnosis Date   Adenomatous colon polyp 01/2003   Anxiety    Arthritis    Cataract    Depression    Diverticulitis    Diverticulitis, colon    Gallstones 10/2021   GERD (gastroesophageal reflux disease)    History of hiatal hernia    Hypertension    Liver cyst    Patient Active Problem List   Diagnosis Date Noted   Symptomatic cholelithiasis 11/08/2021   Esophageal reflux 02/16/2011   Personal history of colonic polyps 02/16/2011   Home Medication(s) Prior to Admission medications   Medication Sig Start Date End Date Taking? Authorizing Provider  acetaminophen (TYLENOL) 325 MG tablet Take 650 mg by mouth every 6 (six) hours as needed for moderate pain.    [provider]  amoxicillin-clavulanate (AUGMENTIN) 875-125 MG tablet Take 1 tablet by mouth every 12 (twelve) hours. 11/14/21   Dione Booze, MD  Cholecalciferol (VITAMIN D3) 75 MCG (3000 UT) TABS Take 6,000 Units by mouth daily.    [provider]  citalopram (CELEXA) 20 MG tablet Take 20-30 mg by mouth daily.    [provider]  COLLAGEN PO Take 10 mLs by mouth daily.    [provider]  lisinopril-hydrochlorothiazide (PRINZIDE,ZESTORETIC) 20-12.5 MG per tablet Take 1 tablet by mouth daily.      [provider]  Multiple  Vitamins-Minerals (PRESERVISION AREDS 2 PO) Take 1 capsule by mouth daily.    [provider]  polyvinyl alcohol (LIQUIFILM TEARS) 1.4 % ophthalmic solution Place 1 drop into both eyes as needed for dry eyes.    [provider]  Probiotic Product (PROBIOTIC PO) Take 1 capsule by mouth daily.    [provider]  psyllium (REGULOID) 0.52 g capsule Take 0.52 g by mouth daily.    [provider]  traMADol (ULTRAM) 50 MG tablet Take 1-2 tablets (50-100 mg total) by mouth every 6 (six) hours as needed for moderate pain. 12/06/21   Darnell Level, MD  tretinoin (RETIN-A) 0.025 % cream Apply 1 application  topically at bedtime. 07/20/21   [provider]  Turmeric 500 MG CAPS Take 1,000 mg by mouth daily.    [provider]  zinc gluconate 50 MG tablet Take 50 mg by mouth daily.    [provider]  Past Surgical History Past Surgical History:  Procedure Laterality Date   BREAST SURGERY     benign breast tumors removed x3   BUNIONECTOMY Right    has screws in toe   CHOLECYSTECTOMY N/A 12/06/2021   Procedure: LAPAROSCOPIC CHOLECYSTECTOMY WITH INTRAOPERATIVE CHOLANGIOGRAM;  Surgeon: Darnell Level, MD;  Location: [facility];  Service: General;  Laterality: N/A;   EYE SURGERY Bilateral    cataract   OOPHORECTOMY     removed later time that hysterectomy   TONSILLECTOMY     VAGINAL HYSTERECTOMY     Family History Family History  Problem Relation Age of Onset   Breast cancer Sister    Diabetes Other        granddaughter   Heart disease Mother    Heart disease Father    Colon cancer Neg Hx    Stomach cancer Neg Hx    Esophageal cancer Neg Hx     Social History Social History   Tobacco Use   Smoking status: Never    Passive exposure: Never   Smokeless tobacco: Never  Vaping Use   Vaping status: Never Used   Substance Use Topics   Alcohol use: Yes    Comment: occasional   Drug use: No   Allergies Aspirin, Celebrex [celecoxib], Nsaids, Bactrim [sulfamethoxazole-trimethoprim], Ciprofloxacin hcl, and Flagyl [metronidazole hcl]  Review of Systems Review of Systems  All other systems reviewed and are negative.   Physical Exam Vital Signs  I have reviewed the triage vital signs BP 130/77 (BP Location: Left Arm)   Pulse 70   Temp 97.9 F (36.6 C) (Oral)   Resp 18   Ht 5\' 2"  (1.575 m)   Wt 72.1 kg   SpO2 97%   BMI 29.08 kg/m  Physical Exam Vitals and nursing note reviewed.  Constitutional:      Appearance: Normal appearance.  HENT:     Head: Normocephalic and atraumatic.     Mouth/Throat:     Mouth: Mucous membranes are moist.  Eyes:     Conjunctiva/sclera: Conjunctivae normal.  Cardiovascular:     Rate and Rhythm: Normal rate.  Pulmonary:     Effort: Pulmonary effort is normal. No respiratory distress.  Abdominal:     General: Abdomen is flat.  Musculoskeletal:        General: No deformity.     Comments: No midline C, T, L-spine tenderness.  Mild right paraspinal cervical tenderness over the trapezius muscle.  Mild tenderness over the right wrist and chronic arthritic changes of the right hand but otherwise no focal deformity or tenderness, full intact range of motion throughout the bilateral upper and lower extremities  Skin:    General: Skin is warm and dry.     Capillary Refill: Capillary refill takes less than 2 seconds.     Comments: Mild abrasion to the right lateral elbow  Neurological:     General: No focal deficit present.     Mental Status: She is alert. Mental status is at baseline.  Psychiatric:        Mood and Affect: Mood normal.        Behavior: Behavior normal.     ED Results and Treatments Labs (all labs ordered are listed, but only abnormal results are displayed) Labs Reviewed  BASIC METABOLIC PANEL - Abnormal; Notable for the following  components:      Result Value   Glucose, Bld 174 (*)    BUN 28 (*)    Creatinine, Ser 1.33 (*)  Calcium 10.4 (*)    GFR, Estimated 39 (*)    All other components within normal limits  MAGNESIUM  CBC WITH DIFFERENTIAL/PLATELET                                                                                                                          Radiology No results found.  Pertinent labs & imaging results that were available during my care of the patient were reviewed by me and considered in my medical decision making (see MDM for details).  Medications Ordered in ED Medications  acetaminophen (TYLENOL) tablet 1,000 mg (1,000 mg Oral Given 09/29/22 2122)  Tdap (BOOSTRIX) injection 0.5 mL (0.5 mLs Intramuscular Given 09/29/22 2244)                                                                                                                                     Procedures Procedures  (including critical care time)  Medical Decision Making / ED Course   MDM:  88 year old female presenting after fall  Examination with some cervical paraspinal muscular tenderness, right elbow abrasion without tenderness or limitation in range of motion, mild wrist tenderness.  Imaging reassuring including normal CT scan, no acute fracture on x-rays.  Patient feeling better.  Wound was irrigated and tetanus updated.  Patient able to ambulate without difficulty.  Only requesting Tylenol.  X-ray of the elbow had some mention of foreign body, but wound appears only to be very shallow and was irrigated thoroughly without evidence of this.  will discharge patient to home. All questions answered. Patient comfortable with plan of discharge. Return precautions discussed with patient and specified on the after visit summary.       Additional history obtained: -Additional history obtained from family   Lab Tests: -I ordered, reviewed, and interpreted labs.   The pertinent results include:   Labs  Reviewed  BASIC METABOLIC PANEL - Abnormal; Notable for the following components:      Result Value   Glucose, Bld 174 (*)    BUN 28 (*)    Creatinine, Ser 1.33 (*)    Calcium 10.4 (*)    GFR, Estimated 39 (*)    All other components within normal limits  MAGNESIUM  CBC WITH DIFFERENTIAL/PLATELET    Notable for mild AKI vs ckd. Has been elevated previously. Last 1.2  EKG   EKG Interpretation Date/Time:  Thursday September 29 2022 15:46:54 EDT Ventricular Rate:  89 PR Interval:  179 QRS Duration:  88 QT Interval:  383 QTC Calculation: 466 R Axis:   14  Text Interpretation: Normal sinus rhythm Ventricular bigeminy Confirmed by Alvino Blood ([id number]) on 09/29/2022 9:12:02 PM         Imaging Studies ordered: I ordered imaging studies including Cts, X-rays On my interpretation imaging demonstrates no acute process I independently visualized and interpreted imaging. I agree with the radiologist interpretation   Medicines ordered and prescription drug management: Meds ordered this encounter  Medications   acetaminophen (TYLENOL) tablet 1,000 mg   Tdap (BOOSTRIX) injection 0.5 mL    -I have reviewed the patients home medicines and have made adjustments as needed    Reevaluation: After the interventions noted above, I reevaluated the patient and found that their symptoms have improved  Co morbidities that complicate the patient evaluation  Past Medical History:  Diagnosis Date   Adenomatous colon polyp 01/2003   Anxiety    Arthritis    Cataract    Depression    Diverticulitis    Diverticulitis, colon    Gallstones 10/2021   GERD (gastroesophageal reflux disease)    History of hiatal hernia    Hypertension    Liver cyst       Dispostion: Disposition decision including need for hospitalization was considered, and patient discharged from emergency department.    Final Clinical Impression(s) / ED Diagnoses Final diagnoses:  Fall in home, initial  encounter  Minor head injury, initial encounter  Abrasion of elbow, unspecified laterality, initial encounter     This chart was dictated using voice recognition software.  Despite best efforts to proofread,  errors can occur which can change the documentation meaning.    Lonell Grandchild, MD 10/02/22 [phone]

## 2022-10-04 DIAGNOSIS — H02403 Unspecified ptosis of bilateral eyelids: Secondary | ICD-10-CM | POA: Diagnosis not present

## 2022-10-04 DIAGNOSIS — H35342 Macular cyst, hole, or pseudohole, left eye: Secondary | ICD-10-CM | POA: Diagnosis not present

## 2022-10-04 DIAGNOSIS — Z961 Presence of intraocular lens: Secondary | ICD-10-CM | POA: Diagnosis not present

## 2022-10-04 DIAGNOSIS — H04123 Dry eye syndrome of bilateral lacrimal glands: Secondary | ICD-10-CM | POA: Diagnosis not present

## 2022-10-15 ENCOUNTER — Emergency Department (HOSPITAL_COMMUNITY)
Admission: EM | Admit: 2022-10-15 | Discharge: 2022-10-16 | Disposition: A | Discharge status: Home / Self Care | Payer: Medicare Other | Attending: Emergency Medicine | Admitting: Emergency Medicine

## 2022-10-15 ENCOUNTER — Emergency Department (HOSPITAL_COMMUNITY): Payer: Medicare Other

## 2022-10-15 ENCOUNTER — Encounter (HOSPITAL_COMMUNITY): Payer: Self-pay

## 2022-10-15 ENCOUNTER — Other Ambulatory Visit: Payer: Self-pay

## 2022-10-15 DIAGNOSIS — K7689 Other specified diseases of liver: Secondary | ICD-10-CM | POA: Diagnosis not present

## 2022-10-15 DIAGNOSIS — R1013 Epigastric pain: Secondary | ICD-10-CM | POA: Diagnosis not present

## 2022-10-15 DIAGNOSIS — R6889 Other general symptoms and signs: Secondary | ICD-10-CM | POA: Diagnosis not present

## 2022-10-15 DIAGNOSIS — I700 Atherosclerosis of aorta: Secondary | ICD-10-CM | POA: Diagnosis not present

## 2022-10-15 DIAGNOSIS — R072 Precordial pain: Secondary | ICD-10-CM

## 2022-10-15 DIAGNOSIS — N281 Cyst of kidney, acquired: Secondary | ICD-10-CM | POA: Diagnosis not present

## 2022-10-15 DIAGNOSIS — I10 Essential (primary) hypertension: Secondary | ICD-10-CM | POA: Diagnosis not present

## 2022-10-15 DIAGNOSIS — R079 Chest pain, unspecified: Secondary | ICD-10-CM | POA: Diagnosis not present

## 2022-10-15 DIAGNOSIS — R109 Unspecified abdominal pain: Secondary | ICD-10-CM | POA: Diagnosis not present

## 2022-10-15 DIAGNOSIS — R0789 Other chest pain: Secondary | ICD-10-CM | POA: Diagnosis not present

## 2022-10-15 DIAGNOSIS — I499 Cardiac arrhythmia, unspecified: Secondary | ICD-10-CM | POA: Diagnosis not present

## 2022-10-15 DIAGNOSIS — Z743 Need for continuous supervision: Secondary | ICD-10-CM | POA: Diagnosis not present

## 2022-10-15 DIAGNOSIS — K449 Diaphragmatic hernia without obstruction or gangrene: Secondary | ICD-10-CM | POA: Insufficient documentation

## 2022-10-15 DIAGNOSIS — K5730 Diverticulosis of large intestine without perforation or abscess without bleeding: Secondary | ICD-10-CM | POA: Diagnosis not present

## 2022-10-15 LAB — CBC
HCT: 40.3 % (ref 36.0–46.0)
Hemoglobin: 13.3 g/dL (ref 12.0–15.0)
MCH: 29.2 pg (ref 26.0–34.0)
MCHC: 33 g/dL (ref 30.0–36.0)
MCV: 88.6 fL (ref 80.0–100.0)
Platelets: 274 10*3/uL (ref 150–400)
RBC: 4.55 MIL/uL (ref 3.87–5.11)
RDW: 12.5 % (ref 11.5–15.5)
WBC: 8.5 10*3/uL (ref 4.0–10.5)
nRBC: 0 % (ref 0.0–0.2)

## 2022-10-15 MED ORDER — SODIUM CHLORIDE 0.9 % IV BOLUS
500.0000 mL | Freq: Once | INTRAVENOUS | Status: AC
  Administered 2022-10-15: 500 mL via INTRAVENOUS

## 2022-10-15 MED ORDER — ALUM & MAG HYDROXIDE-SIMETH 200-200-20 MG/5ML PO SUSP
15.0000 mL | Freq: Once | ORAL | Status: AC
  Administered 2022-10-15: 15 mL via ORAL
  Filled 2022-10-15: qty 30

## 2022-10-15 MED ORDER — PANTOPRAZOLE 80MG IVPB - SIMPLE MED
80.0000 mg | Freq: Once | INTRAVENOUS | Status: AC
  Administered 2022-10-16: 80 mg via INTRAVENOUS
  Filled 2022-10-15: qty 100

## 2022-10-15 NOTE — ED Notes (Signed)
Patient transported to X-ray 

## 2022-10-15 NOTE — ED Provider Notes (Signed)
Emergency Department Provider Note   I have reviewed the triage vital signs and the nursing notes.   HISTORY  Chief Complaint Chest Pain   HPI Valerie Fox is a 88 y.o. female with past history of hypertension, hiatal hernia, GERD presents to the emergency department with left-sided chest discomfort along with epigastric fullness/tightness.  She describes frequent belching and occasional pressure with sharp pain in the left chest.  Mild shortness of breath earlier but that is resolved.  No specific pleuritic pain.  No severe pain radiating to the back.  Her epigastric pain radiates across her entire upper abdomen with some associated pressure.  She took Tums without relief and ultimately called EMS.  She was found to be very hypertensive with systolic pressure of 220.  She was given 1 sublingual nitroglycerin with improvement in blood pressure.  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  She is not anticoagulated.  No prior history of ACS.   Past Medical History:  Diagnosis Date   Adenomatous colon polyp 01/2003   Anxiety    Arthritis    Cataract    Depression    Diverticulitis    Diverticulitis, colon    Gallstones 10/2021   GERD (gastroesophageal reflux disease)    History of hiatal hernia    Hypertension    Liver cyst     Review of Systems  Constitutional: No fever/chills Cardiovascular: Positive chest pain. Respiratory: Mild shortness of breath (resolved) Gastrointestinal: Positive epigastric abdominal pain.  No nausea, no vomiting.  No diarrhea.  No constipation. Musculoskeletal: Negative for back pain. Skin: Negative for rash. Neurological: Negative for headaches, focal weakness or numbness.  ____________________________________________   PHYSICAL EXAM:  VITAL SIGNS: ED Triage Vitals  Encounter Vitals Group     BP 10/15/22 2257 (!) 146/69     Pulse Rate 10/15/22 2257 (!) 40     Resp 10/15/22 2257 (!) 23     Temp  10/15/22 2257 (!) 97.5 F (36.4 C)     Temp Source 10/15/22 2257 Oral     SpO2 10/15/22 2257 98 %     Weight 10/15/22 2303 170 lb (77.1 kg)     Height 10/15/22 2303 5\' 2"  (1.575 m)   Constitutional: Alert and oriented. Well appearing and in no acute distress. Eyes: Conjunctivae are normal.  Head: Atraumatic. Nose: No congestion/rhinnorhea. Mouth/Throat: Mucous membranes are moist.  Cardiovascular: Sinus bradycardia with frequent PVCs. Good peripheral circulation. Grossly normal heart sounds.   Respiratory: Normal respiratory effort.  No retractions. Lungs CTAB. Gastrointestinal: Soft and nontender. No distention.  Musculoskeletal: No lower extremity tenderness nor edema. No gross deformities of extremities. Neurologic:  Normal speech and language.  Skin:  Skin is warm, dry and intact. No rash noted.  ____________________________________________   LABS (all labs ordered are listed, but only abnormal results are displayed)  Labs Reviewed  CBC  BASIC METABOLIC PANEL  LIPASE, BLOOD  HEPATIC FUNCTION PANEL  TROPONIN I (HIGH SENSITIVITY)   ____________________________________________  EKG   EKG Interpretation Date/Time:  Saturday October 15 2022 23:04:15 EDT Ventricular Rate:  80 PR Interval:  185 QRS Duration:  94 QT Interval:  419 QTC Calculation: 484 R Axis:   4  Text Interpretation: Sinus rhythm Ventricular trigeminy Similar to Aug 2024 tracing Confirmed by Alona Bene [phone]) on 10/15/2022 11:14:03 PM        ____________________________________________  RADIOLOGY  No results found.  ____________________________________________   PROCEDURES  Procedure(s) performed:  Procedures   ____________________________________________   INITIAL IMPRESSION / ASSESSMENT AND PLAN / ED COURSE  Pertinent labs & imaging results that were available during my care of the patient were reviewed by me and considered in my medical decision making (see chart for  details).   This patient is Presenting for Evaluation of CP, which does require a range of treatment options, and is a complaint that involves a high risk of morbidity and mortality.  The Differential Diagnoses includes but is not exclusive to acute coronary syndrome, aortic dissection, pulmonary embolism, cardiac tamponade, community-acquired pneumonia, pericarditis, musculoskeletal chest wall pain, etc.   Critical Interventions-    Medications  sodium chloride 0.9 % bolus 500 mL (has no administration in time range)  alum & mag hydroxide-simeth (MAALOX/MYLANTA) 200-200-20 MG/5ML suspension 15 mL (has no administration in time range)  pantoprazole (PROTONIX) 80 mg /NS 100 mL IVPB (has no administration in time range)    Reassessment after intervention:     I did obtain Additional Historical Information from EMS.   I decided to review pertinent External Data, and in summary last ED visit was 3 weeks prior for a seemingly unrelated issue.   Clinical Laboratory Tests Ordered, included ***  Radiologic Tests Ordered, included CXR. I independently interpreted the images and agree with radiology interpretation.   Cardiac Monitor Tracing which shows NSR with PVCs.   Social Determinants of Health Risk patient is a non-smoker.   Consult complete with  Medical Decision Making: Summary:  Patient presents to the ED with CP and epigastric pain. My initial suspicion is that this is most likely GI in nature, possibly from known hiatal hernia. Will need further consideration, however, of cardiac and vascular etiologies of pain.   Reevaluation with update and discussion with   ***Considered admission***  Patient's presentation is most consistent with acute presentation with potential threat to life or bodily function.   Disposition:   ____________________________________________  FINAL CLINICAL IMPRESSION(S) / ED DIAGNOSES  Final diagnoses:  None     NEW OUTPATIENT MEDICATIONS  STARTED DURING THIS VISIT:  New Prescriptions   No medications on file    Note:  This document was prepared using Dragon voice recognition software and may include unintentional dictation errors.  Alona Bene, MD, [facility] Emergency Medicine

## 2022-10-15 NOTE — ED Triage Notes (Signed)
Patient BIB [facility] from home with complaint of left sided CP at 1930 with intermittent upper abdominal pain.  Pain described as sharpe pressure.   Per EMS patient hypertensive at 220/100  EMS administered 0.4 nitri Post nitroglycerin BP 132/90  EMS noted frequent PVC's on 12 lead

## 2022-10-16 ENCOUNTER — Emergency Department (HOSPITAL_COMMUNITY): Payer: Medicare Other

## 2022-10-16 DIAGNOSIS — K7689 Other specified diseases of liver: Secondary | ICD-10-CM | POA: Diagnosis not present

## 2022-10-16 DIAGNOSIS — R079 Chest pain, unspecified: Secondary | ICD-10-CM | POA: Diagnosis not present

## 2022-10-16 DIAGNOSIS — N281 Cyst of kidney, acquired: Secondary | ICD-10-CM | POA: Diagnosis not present

## 2022-10-16 DIAGNOSIS — K5730 Diverticulosis of large intestine without perforation or abscess without bleeding: Secondary | ICD-10-CM | POA: Diagnosis not present

## 2022-10-16 LAB — HEPATIC FUNCTION PANEL
ALT: 16 U/L (ref 0–44)
AST: 20 U/L (ref 15–41)
Albumin: 3.6 g/dL (ref 3.5–5.0)
Alkaline Phosphatase: 48 U/L (ref 38–126)
Bilirubin, Direct: <0.1 mg/dL (ref 0.0–0.2)
Total Bilirubin: 0.4 mg/dL (ref 0.3–1.2)
Total Protein: 5.8 g/dL — ABNORMAL LOW (ref 6.5–8.1)

## 2022-10-16 LAB — TROPONIN I (HIGH SENSITIVITY)
Troponin I (High Sensitivity): 5 ng/L (ref <18)
Troponin I (High Sensitivity): 5 ng/L (ref <18)

## 2022-10-16 LAB — BASIC METABOLIC PANEL
Anion gap: 13 (ref 5–15)
BUN: 24 mg/dL — ABNORMAL HIGH (ref 8–23)
CO2: 28 mmol/L (ref 22–32)
Calcium: 10.5 mg/dL — ABNORMAL HIGH (ref 8.9–10.3)
Chloride: 100 mmol/L (ref 98–111)
Creatinine, Ser: 1.17 mg/dL — ABNORMAL HIGH (ref 0.44–1.00)
GFR, Estimated: 46 mL/min — ABNORMAL LOW (ref >60)
Glucose, Bld: 98 mg/dL (ref 70–99)
Potassium: 3.7 mmol/L (ref 3.5–5.1)
Sodium: 141 mmol/L (ref 135–145)

## 2022-10-16 LAB — LIPASE, BLOOD: Lipase: 48 U/L (ref 11–51)

## 2022-10-16 MED ORDER — IOHEXOL 350 MG/ML SOLN
100.0000 mL | Freq: Once | INTRAVENOUS | Status: AC | PRN
  Administered 2022-10-16: 100 mL via INTRAVENOUS

## 2022-10-16 NOTE — ED Notes (Signed)
Patient transported to CT 

## 2022-10-16 NOTE — Discharge Instructions (Signed)
You were seen in the emergency department today with chest discomfort.  Your lab work for heart attack and CT scans were reassuring.  I question if your pain may have been caused by your hiatal hernia.  I would like for you to follow with your primary care doctor and your gastroenterology team in the coming week for reevaluation.  If you develop any new or suddenly worsening symptoms please return to the emergency department for reevaluation.

## 2022-10-19 DIAGNOSIS — K219 Gastro-esophageal reflux disease without esophagitis: Secondary | ICD-10-CM | POA: Diagnosis not present

## 2022-10-19 DIAGNOSIS — R009 Unspecified abnormalities of heart beat: Secondary | ICD-10-CM | POA: Diagnosis not present

## 2022-10-19 DIAGNOSIS — R8290 Unspecified abnormal findings in urine: Secondary | ICD-10-CM | POA: Diagnosis not present

## 2022-10-19 DIAGNOSIS — Z9181 History of falling: Secondary | ICD-10-CM | POA: Diagnosis not present

## 2022-10-19 DIAGNOSIS — H5789 Other specified disorders of eye and adnexa: Secondary | ICD-10-CM | POA: Diagnosis not present

## 2022-11-02 DIAGNOSIS — M21622 Bunionette of left foot: Secondary | ICD-10-CM | POA: Diagnosis not present

## 2022-11-02 DIAGNOSIS — M2042 Other hammer toe(s) (acquired), left foot: Secondary | ICD-10-CM | POA: Diagnosis not present

## 2022-11-02 DIAGNOSIS — M2012 Hallux valgus (acquired), left foot: Secondary | ICD-10-CM | POA: Diagnosis not present

## 2022-12-11 DIAGNOSIS — R350 Frequency of micturition: Secondary | ICD-10-CM | POA: Diagnosis not present

## 2022-12-11 DIAGNOSIS — N3000 Acute cystitis without hematuria: Secondary | ICD-10-CM | POA: Diagnosis not present

## 2022-12-21 DIAGNOSIS — N390 Urinary tract infection, site not specified: Secondary | ICD-10-CM | POA: Diagnosis not present

## 2022-12-21 DIAGNOSIS — L74519 Primary focal hyperhidrosis, unspecified: Secondary | ICD-10-CM | POA: Diagnosis not present

## 2022-12-21 DIAGNOSIS — N1831 Chronic kidney disease, stage 3a: Secondary | ICD-10-CM | POA: Diagnosis not present

## 2022-12-21 DIAGNOSIS — Z0000 Encounter for general adult medical examination without abnormal findings: Secondary | ICD-10-CM | POA: Diagnosis not present

## 2022-12-21 DIAGNOSIS — K9189 Other postprocedural complications and disorders of digestive system: Secondary | ICD-10-CM | POA: Diagnosis not present

## 2022-12-21 DIAGNOSIS — I700 Atherosclerosis of aorta: Secondary | ICD-10-CM | POA: Diagnosis not present

## 2022-12-21 DIAGNOSIS — E782 Mixed hyperlipidemia: Secondary | ICD-10-CM | POA: Diagnosis not present

## 2022-12-21 DIAGNOSIS — I10 Essential (primary) hypertension: Secondary | ICD-10-CM | POA: Diagnosis not present

## 2022-12-21 DIAGNOSIS — K219 Gastro-esophageal reflux disease without esophagitis: Secondary | ICD-10-CM | POA: Diagnosis not present

## 2022-12-21 DIAGNOSIS — Z23 Encounter for immunization: Secondary | ICD-10-CM | POA: Diagnosis not present

## 2022-12-22 ENCOUNTER — Other Ambulatory Visit: Payer: Self-pay | Admitting: Family Medicine

## 2022-12-22 DIAGNOSIS — R9389 Abnormal findings on diagnostic imaging of other specified body structures: Secondary | ICD-10-CM

## 2022-12-28 ENCOUNTER — Ambulatory Visit
Admission: RE | Admit: 2022-12-28 | Discharge: 2022-12-28 | Disposition: A | Discharge status: Home / Self Care | Payer: Medicare Other | Source: Ambulatory Visit | Attending: Family Medicine | Admitting: Family Medicine

## 2022-12-28 DIAGNOSIS — N281 Cyst of kidney, acquired: Secondary | ICD-10-CM | POA: Diagnosis not present

## 2022-12-28 DIAGNOSIS — N289 Disorder of kidney and ureter, unspecified: Secondary | ICD-10-CM | POA: Diagnosis not present

## 2022-12-28 DIAGNOSIS — K449 Diaphragmatic hernia without obstruction or gangrene: Secondary | ICD-10-CM | POA: Diagnosis not present

## 2022-12-28 DIAGNOSIS — R9389 Abnormal findings on diagnostic imaging of other specified body structures: Secondary | ICD-10-CM

## 2022-12-28 DIAGNOSIS — I700 Atherosclerosis of aorta: Secondary | ICD-10-CM | POA: Insufficient documentation

## 2022-12-28 MED ORDER — IOPAMIDOL (ISOVUE-300) INJECTION 61%
500.0000 mL | Freq: Once | INTRAVENOUS | Status: AC | PRN
  Administered 2022-12-28: 80 mL via INTRAVENOUS

## 2023-01-09 DIAGNOSIS — Z1231 Encounter for screening mammogram for malignant neoplasm of breast: Secondary | ICD-10-CM | POA: Diagnosis not present

## 2023-02-09 ENCOUNTER — Encounter (HOSPITAL_BASED_OUTPATIENT_CLINIC_OR_DEPARTMENT_OTHER): Payer: Self-pay | Admitting: Cardiology

## 2023-02-09 ENCOUNTER — Ambulatory Visit (HOSPITAL_BASED_OUTPATIENT_CLINIC_OR_DEPARTMENT_OTHER): Payer: Medicare Other | Admitting: Cardiology

## 2023-02-09 VITALS — BP 110/80 | HR 75 | Ht 62.0 in | Wt 157.3 lb

## 2023-02-09 DIAGNOSIS — I493 Ventricular premature depolarization: Secondary | ICD-10-CM | POA: Diagnosis not present

## 2023-02-09 DIAGNOSIS — Z712 Person consulting for explanation of examination or test findings: Secondary | ICD-10-CM | POA: Diagnosis not present

## 2023-02-09 DIAGNOSIS — R079 Chest pain, unspecified: Secondary | ICD-10-CM | POA: Diagnosis not present

## 2023-02-09 DIAGNOSIS — I10 Essential (primary) hypertension: Secondary | ICD-10-CM | POA: Diagnosis not present

## 2023-02-09 NOTE — Patient Instructions (Signed)

## 2023-02-09 NOTE — Progress Notes (Signed)
 Cardiology Office Note:  .   Date:  02/09/2023  ID:  Valerie Fox, DOB 04-Jun-1934, MRN [id number] PCP: Okey Carlin Redbird, MD  [facility] Providers Cardiologist:  Shelda Bruckner, MD {  History of Present Illness: Valerie   CHANTE Fox is a 89 y.o. female with PMH hypertension, GERD/hiatal hernia. Referred by Dr. Okey for abnormal heart beat.  Today: Note from Dr. Okey from 10/19/22 reviewed. Noted remote history of heart thickening and PVCs remotely followed by Dr. Blanca. Referred to cardiology for further evaluation. I see her husband.  She saw Dr. Blanca about 6 years ago. She was concerned about afib, but testing did not show any afib. His recommendation at that time was to consider starting cholesterol medication. She prefers to avoid medication.  Was first told she had PVCs about 30 years ago. Notices PVCs intermittently. Was told at her last visit with Dr. Okey she was skipping about every 3rd beat. She was asymptomatic at that time. She feels them maybe a few times a month, lasts a few minutes. Has never been on medication for PVCs.  When she was having severe GI pain 10/2022 from her hiatal hernia, the EMS team told her she wsa having a lot of PVCs. Had workup, cardiac ruled out. hsTn normal.  Had CT angio 10/2022, noted mild aortic calcifications in aortic arch. Negative for dissection. I personally reviewed images--there is motion but I cannot appreciate significant coronary calcifications.  Echo report from 05/16/2017 from Dr. Blanca reviewed. EF 60%, mild asymmetric septal hypertrophy. Mild AR, TR, PR, trace MR.  ROS: Denies chest pain, shortness of breath at rest or with normal exertion. No PND, orthopnea, LE edema or unexpected weight gain. No syncope or palpitations. ROS otherwise negative except as noted.   Studies Reviewed: Valerie    EKG:       Physical Exam:   VS:  BP 110/80   Pulse 75   Ht 5' 2 (1.575 m)   Wt 157 lb 4.8 oz (71.4 kg)   SpO2 95%   BMI  28.77 kg/m    Wt Readings from Last 3 Encounters:  02/09/23 157 lb 4.8 oz (71.4 kg)  10/15/22 170 lb (77.1 kg)  09/29/22 159 lb (72.1 kg)    GEN: Well nourished, well developed in no acute distress HEENT: Normal, moist mucous membranes NECK: No JVD CARDIAC: regular rhythm, normal S1 and S2, no rubs or gallops. No murmur. 1 PVC in 25 beats. VASCULAR: Radial and DP pulses 2+ bilaterally. No carotid bruits RESPIRATORY:  Clear to auscultation without rales, wheezing or rhonchi  ABDOMEN: Soft, non-tender, non-distended MUSCULOSKELETAL:  Ambulates independently SKIN: Warm and dry, no edema NEUROLOGIC:  Alert and oriented x 3. No focal neuro deficits noted. PSYCHIATRIC:  Normal affect    ASSESSMENT AND PLAN: .    PVCs -we discussed monitor, medication, or watchful monitoring. After shared decision making, will monitor symptoms. She will contact me if frequency/duration/symptoms worsen. -reviewed red flag warning signs that need immediate medical attention  Hypertension -well controlled today -continue lisinopril-HCTZ  ER visit for chest pain -cardiac workup unremarkable, thought to be GI in nature -reviewed her CT angio, mild aortic calcification, I did not appreciate coronary calcification though it was not a dedicated study -lipids from 11.2024 reviewed, Tchol 208, HDL 68, LDL 128, TG 90  Dispo: 1 year or sooner as needed  Signed, Shelda Bruckner, MD   Shelda Bruckner, MD, PhD, [facility]  [facility]  [place]  Heart & Vascular at [facility] at [facility] [address] [place], [place] [address] [phone]

## 2023-08-13 DIAGNOSIS — R110 Nausea: Secondary | ICD-10-CM | POA: Diagnosis not present

## 2023-08-13 DIAGNOSIS — K5792 Diverticulitis of intestine, part unspecified, without perforation or abscess without bleeding: Secondary | ICD-10-CM | POA: Diagnosis not present

## 2023-10-12 DIAGNOSIS — H9201 Otalgia, right ear: Secondary | ICD-10-CM | POA: Diagnosis not present

## 2023-10-12 DIAGNOSIS — H6121 Impacted cerumen, right ear: Secondary | ICD-10-CM | POA: Diagnosis not present

## 2024-02-02 ENCOUNTER — Encounter (HOSPITAL_BASED_OUTPATIENT_CLINIC_OR_DEPARTMENT_OTHER): Payer: Self-pay | Admitting: Cardiology

## 2024-02-02 ENCOUNTER — Ambulatory Visit (HOSPITAL_BASED_OUTPATIENT_CLINIC_OR_DEPARTMENT_OTHER): Admitting: Cardiology

## 2024-02-02 VITALS — BP 110/62 | HR 72 | Ht 62.0 in | Wt 156.8 lb

## 2024-02-02 DIAGNOSIS — I700 Atherosclerosis of aorta: Secondary | ICD-10-CM

## 2024-02-02 DIAGNOSIS — Z7189 Other specified counseling: Secondary | ICD-10-CM | POA: Diagnosis not present

## 2024-02-02 DIAGNOSIS — I493 Ventricular premature depolarization: Secondary | ICD-10-CM

## 2024-02-02 DIAGNOSIS — I10 Essential (primary) hypertension: Secondary | ICD-10-CM | POA: Diagnosis not present

## 2024-02-02 NOTE — Progress Notes (Signed)
" °  Cardiology Office Note:  .   Date:  02/02/2024  ID:  Valerie Fox, DOB 1934-08-26, MRN [id number] PCP: Okey Carlin Redbird, MD  [facility] Providers Cardiologist:  Shelda Bruckner, MD {  History of Present Illness: Valerie Fox is a 89 y.o. female with PMH hypertension, PVCs. I met her 02/09/23 for evaluation of abnormal heartbeat.  CV history: remote history of heart thickening and PVCs remotely followed by Dr. Blanca. Was first told she had PVCs about 30 years ago. Notices PVCs intermittently, has never been on medication for them. When she was having severe GI pain 10/2022 from her hiatal hernia, the EMS team told her she was having a lot of PVCs. Had workup, cardiac ruled out. hsTn normal. CT angio 10/2022 noted mild aortic calcifications in aortic arch. Negative for dissection. I personally reviewed images--there is motion but I cannot appreciate significant coronary calcifications.  Echo report from 05/16/2017 from Dr. Blanca reviewed. EF 60%, mild asymmetric septal hypertrophy. Mild AR, TR, PR, trace MR.  Today: Overall doing well. Tries to stay active, goes out to the yard, but feels sore/stiff after. No chest pain/SOB. Feels rare PVC rush, feels it at rest, short duration. No other associated symptoms.  ROS: Denies chest pain, shortness of breath at rest or with normal exertion. No PND, orthopnea, LE edema or unexpected weight gain. No syncope or palpitations. ROS otherwise negative except as noted.   Studies Reviewed: Valerie    EKG:       Physical Exam:   VS:  BP 110/62 (BP Location: Left Arm, Patient Position: Sitting, Cuff Size: Normal)   Pulse 72   Ht 5' 2 (1.575 m)   Wt 156 lb 12.8 oz (71.1 kg)   SpO2 96%   BMI 28.68 kg/m    Wt Readings from Last 3 Encounters:  02/02/24 156 lb 12.8 oz (71.1 kg)  02/09/23 157 lb 4.8 oz (71.4 kg)  10/15/22 170 lb (77.1 kg)    GEN: Well nourished, well developed in no acute distress HEENT: Normal, moist mucous  membranes NECK: No JVD CARDIAC: regular rhythm, normal S1 and S2, no rubs or gallops. No murmur. 1 PVC in 25 beats. VASCULAR: Radial and DP pulses 2+ bilaterally. No carotid bruits RESPIRATORY:  Clear to auscultation without rales, wheezing or rhonchi  ABDOMEN: Soft, non-tender, non-distended MUSCULOSKELETAL:  Ambulates independently SKIN: Warm and dry, no edema NEUROLOGIC:  Alert and oriented x 3. No focal neuro deficits noted. PSYCHIATRIC:  Normal affect    ASSESSMENT AND PLAN: .    PVCs -we discussed monitor, medication, or watchful monitoring. After shared decision making, will monitor symptoms. She will contact me if frequency/duration/symptoms worsen. -reviewed red flag warning signs that need immediate medical attention  Hypertension -well controlled today -continue lisinopril-HCTZ  Aortic atherosclerosis (on CT 10/2022) -has been recommended for statin in the past, declined. Given age, reasonable to not pursue statin at this time per patient preference -lipids from 12/2022 reviewed, Tchol 208, HDL 68, LDL 128, TG 90 -reviewed secondary prevention recommendations  Dispo: 1 year or sooner as needed  Signed, Shelda Bruckner, MD   Shelda Bruckner, MD, PhD, [facility] [place]  [facility]  [place]  Heart & Vascular at [facility] at [facility] [address], [address] [place], [place] [address] [phone]   "

## 2024-02-02 NOTE — Patient Instructions (Addendum)
